# Patient Record
Sex: Female | Born: 2000 | Hispanic: Yes | Marital: Single | Smoking: Never smoker
Health system: Southern US, Community
[De-identification: ages and names within clinical notes are randomized; demographics above are authoritative.]

## PROBLEM LIST (undated history)

## (undated) DIAGNOSIS — O139 Gestational [pregnancy-induced] hypertension without significant proteinuria, unspecified trimester: Secondary | ICD-10-CM

## (undated) DIAGNOSIS — Z789 Other specified health status: Secondary | ICD-10-CM

## (undated) HISTORY — DX: Other specified health status: Z78.9

## (undated) HISTORY — PX: NO PAST SURGERIES: SHX2092

---
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
## 2021-02-18 DIAGNOSIS — R000 Tachycardia, unspecified: Secondary | ICD-10-CM | POA: Insufficient documentation

## 2021-02-18 DIAGNOSIS — R002 Palpitations: Secondary | ICD-10-CM | POA: Insufficient documentation

## 2021-02-18 DIAGNOSIS — E66811 Obesity, class 1: Secondary | ICD-10-CM | POA: Insufficient documentation

## 2022-04-24 ENCOUNTER — Ambulatory Visit: Payer: Medicaid Other

## 2022-04-24 DIAGNOSIS — Z23 Encounter for immunization: Secondary | ICD-10-CM

## 2022-06-11 ENCOUNTER — Ambulatory Visit: Payer: Medicaid Other | Admitting: Family Medicine

## 2022-09-01 ENCOUNTER — Telehealth: Payer: Self-pay

## 2022-09-01 NOTE — Telephone Encounter (Signed)
Patient called with concerns of white drainage coming out of her incision, warmth and finger length  opening now. She has some mild cramping. She denies fever. Please advise.

## 2022-09-03 NOTE — Telephone Encounter (Signed)
Attempted to call patient and follow-up, unable to leave voicemail at this time.

## 2023-03-24 ENCOUNTER — Encounter: Payer: Self-pay | Admitting: *Deleted

## 2023-03-24 ENCOUNTER — Other Ambulatory Visit: Payer: Self-pay | Admitting: *Deleted

## 2023-03-25 NOTE — L&D Delivery Note (Signed)
 OB/GYN Faculty Practice Delivery Note  Joann Martinez is a 23 y.o. [id number] s/p SVD at [redacted]w[redacted]d. She was admitted for IOL for LGA, newly Dx GHTN during admission.   ROM: 1h 63m with clear fluid GBS Status:  Negative/-- (07/11 1146) Maximum Maternal Temperature: 98.67F  Labor Progress: Initial SVE: 0.5/50/-3. Ripened with Cytotec  and Foley balloon. Augmented with AROM. Epidural. She then progressed to complete @0330 .   Delivery Date/Time: 10/24/2023 @0519   Delivery: Called to room and patient was complete and pushing. Head delivered LOA. Loose nuchal cord present, delivered through. Shoulder and body delivered in usual fashion. Infant with spontaneous cry, placed on mother's abdomen, dried and stimulated. Cord clamped x 2 after 1-minute delay, and cut by FOB. Cord blood drawn. Placenta delivered spontaneously with gentle cord traction. Fundus firm with massage and Pitocin . Labia, perineum, vagina, and cervix inspected with 1st degree perineal laceration. Repaired with 3.0 Vicryl.   Baby Weight: 4250g   Placenta: 3 vessel, intact. Sent to L&D Complications: None Lacerations: 1st degree perineal  EBL: 360 mL Analgesia: Epidural   Infant:  APGAR (1 MIN): 9  APGAR (5 MINS): 9   Mardy Shropshire, MD OB Family Medicine Fellow, [facility] for [facility], [facility] 10/24/2023, 7:07 AM 4250

## 2023-04-08 ENCOUNTER — Other Ambulatory Visit (INDEPENDENT_AMBULATORY_CARE_PROVIDER_SITE_OTHER): Payer: Self-pay

## 2023-04-08 ENCOUNTER — Ambulatory Visit: Payer: Medicaid Other | Admitting: *Deleted

## 2023-04-08 VITALS — BP 119/79 | HR 80 | Wt 162.4 lb

## 2023-04-08 DIAGNOSIS — Z1339 Encounter for screening examination for other mental health and behavioral disorders: Secondary | ICD-10-CM

## 2023-04-08 DIAGNOSIS — Z3A10 10 weeks gestation of pregnancy: Secondary | ICD-10-CM | POA: Diagnosis not present

## 2023-04-08 DIAGNOSIS — Z3401 Encounter for supervision of normal first pregnancy, first trimester: Secondary | ICD-10-CM

## 2023-04-08 DIAGNOSIS — Z3480 Encounter for supervision of other normal pregnancy, unspecified trimester: Secondary | ICD-10-CM | POA: Insufficient documentation

## 2023-04-08 DIAGNOSIS — O3680X0 Pregnancy with inconclusive fetal viability, not applicable or unspecified: Secondary | ICD-10-CM

## 2023-04-08 DIAGNOSIS — Z3481 Encounter for supervision of other normal pregnancy, first trimester: Secondary | ICD-10-CM | POA: Diagnosis not present

## 2023-04-08 DIAGNOSIS — Z3400 Encounter for supervision of normal first pregnancy, unspecified trimester: Secondary | ICD-10-CM

## 2023-04-08 MED ORDER — BLOOD PRESSURE KIT DEVI
1.0000 | 0 refills | Status: AC
Start: 2023-04-08 — End: ?

## 2023-04-08 MED ORDER — VITAFOL ULTRA 29-0.6-0.4-200 MG PO CAPS: 1.0000 | ORAL_CAPSULE | Freq: Every day | ORAL | 4 refills | Status: AC

## 2023-04-08 NOTE — Progress Notes (Signed)
New OB Intake  I connected with Joann Martinez  on 04/08/23 at 10:15 AM EST by In Person Video Visit and verified that I am speaking with the correct person using two identifiers. Nurse is located at [facility] and pt is located at [place].  I discussed the limitations, risks, security and privacy concerns of performing an evaluation and management service by telephone and the availability of in person appointments. I also discussed with the patient that there may be a patient responsible charge related to this service. The patient expressed understanding and agreed to proceed.  I explained I am completing New OB Intake today. We discussed EDD of 11/02/2023, by Last Menstrual Period. Pt is G2P1001. I reviewed her allergies, medications and Medical/Surgical/OB history.    Patient Active Problem List   Diagnosis Date Noted   Class 1 obesity in adult 02/18/2021   Palpitations 02/18/2021   Sinus tachycardia 02/18/2021    Concerns addressed today  Delivery Plans Plans to deliver at [facility]. Discussed the nature of our practice with multiple providers including residents and students. Due to the size of the practice, the delivering provider may not be the same as those providing prenatal care.   Patient is not interested in water birth. Offered upcoming OB visit with CNM to discuss further.  MyChart/Babyscripts MyChart access verified. I explained pt will have some visits in office and some virtually. Babyscripts instructions given and order placed. Patient verifies receipt of registration text/e-mail. Account successfully created and app downloaded. If patient is a candidate for Optimized scheduling, add to sticky note.   Blood Pressure Cuff/Weight Scale Blood pressure cuff ordered for patient to pick-up from [employer]. Explained after first prenatal appt pt will check weekly and document in Babyscripts. Patient does not have weight scale; patient may purchase if they desire to  track weight weekly in Babyscripts.  Anatomy US Explained first scheduled [place] will be around 19 weeks. Anatomy US scheduled for TBD at TBD.  Interested in [place]? If yes, send referral and doula dot phrase.   Is patient a candidate for Babyscripts Optimization? No, due to Risk Factors   First visit review I reviewed new OB appt with patient. Explained pt will be seen by Dr. Nobie Putnam at first visit. Discussed Avelina Laine genetic screening with patient. Requests Panorama  Routine prenatal labs  collected at today's visit.    Last Pap No results found for: "DIAGPAP"  Harrel Lemon, RN 04/08/2023  10:43 AM

## 2023-04-08 NOTE — Patient Instructions (Signed)

## 2023-04-10 LAB — CBC/D/PLT+RPR+RH+ABO+RUBIGG...
Antibody Screen: NEGATIVE
Basophils Absolute: 0 10*3/uL (ref 0.0–0.2)
Basos: 0 %
EOS (ABSOLUTE): 0.1 10*3/uL (ref 0.0–0.4)
Eos: 1 %
HCV Ab: NONREACTIVE
HIV Screen 4th Generation wRfx: NONREACTIVE
Hematocrit: 41.5 % (ref 34.0–46.6)
Hemoglobin: 13.6 g/dL (ref 11.1–15.9)
Hepatitis B Surface Ag: NEGATIVE
Immature Grans (Abs): 0.1 10*3/uL (ref 0.0–0.1)
Immature Granulocytes: 1 %
Lymphocytes Absolute: 1.7 10*3/uL (ref 0.7–3.1)
Lymphs: 20 %
MCH: 28.2 pg (ref 26.6–33.0)
MCHC: 32.8 g/dL (ref 31.5–35.7)
MCV: 86 fL (ref 79–97)
Monocytes Absolute: 0.4 10*3/uL (ref 0.1–0.9)
Monocytes: 5 %
Neutrophils Absolute: 6.4 10*3/uL (ref 1.4–7.0)
Neutrophils: 73 %
Platelets: 324 10*3/uL (ref 150–450)
RBC: 4.82 x10E6/uL (ref 3.77–5.28)
RDW: 14.2 % (ref 11.7–15.4)
RPR Ser Ql: NONREACTIVE
Rh Factor: POSITIVE
Rubella Antibodies, IGG: <0.9 {index} — ABNORMAL LOW (ref >0.99)
WBC: 8.7 10*3/uL (ref 3.4–10.8)

## 2023-04-10 LAB — URINE CULTURE, OB REFLEX

## 2023-04-10 LAB — CULTURE, OB URINE

## 2023-04-10 LAB — HCV INTERPRETATION

## 2023-04-16 LAB — PANORAMA PRENATAL TEST FULL PANEL:PANORAMA TEST PLUS 5 ADDITIONAL MICRODELETIONS: FETAL FRACTION: 13

## 2023-05-13 ENCOUNTER — Encounter: Payer: Medicaid Other | Admitting: Family Medicine

## 2023-05-28 ENCOUNTER — Ambulatory Visit: Payer: Medicaid Other | Admitting: Nurse Practitioner

## 2023-05-28 ENCOUNTER — Other Ambulatory Visit (HOSPITAL_COMMUNITY)
Admission: RE | Admit: 2023-05-28 | Discharge: 2023-05-28 | Disposition: A | Discharge status: Home / Self Care | Source: Ambulatory Visit | Attending: Nurse Practitioner | Admitting: Nurse Practitioner

## 2023-05-28 VITALS — BP 108/74 | HR 87 | Wt 172.3 lb

## 2023-05-28 DIAGNOSIS — Z3480 Encounter for supervision of other normal pregnancy, unspecified trimester: Secondary | ICD-10-CM | POA: Diagnosis present

## 2023-05-28 DIAGNOSIS — O09892 Supervision of other high risk pregnancies, second trimester: Secondary | ICD-10-CM

## 2023-05-28 DIAGNOSIS — Z3A17 17 weeks gestation of pregnancy: Secondary | ICD-10-CM

## 2023-05-28 DIAGNOSIS — O0992 Supervision of high risk pregnancy, unspecified, second trimester: Secondary | ICD-10-CM | POA: Diagnosis not present

## 2023-05-28 DIAGNOSIS — Z2839 Other underimmunization status: Secondary | ICD-10-CM

## 2023-05-28 DIAGNOSIS — N760 Acute vaginitis: Secondary | ICD-10-CM | POA: Diagnosis not present

## 2023-05-28 DIAGNOSIS — Z3A18 18 weeks gestation of pregnancy: Secondary | ICD-10-CM

## 2023-05-28 DIAGNOSIS — O09899 Supervision of other high risk pregnancies, unspecified trimester: Secondary | ICD-10-CM

## 2023-05-28 DIAGNOSIS — B9689 Other specified bacterial agents as the cause of diseases classified elsewhere: Secondary | ICD-10-CM

## 2023-05-28 DIAGNOSIS — Z8679 Personal history of other diseases of the circulatory system: Secondary | ICD-10-CM

## 2023-05-28 MED ORDER — ASPIRIN 81 MG PO TBEC: 81.0000 mg | DELAYED_RELEASE_TABLET | Freq: Every day | ORAL | 12 refills | Status: DC

## 2023-05-28 NOTE — Progress Notes (Signed)
 Pt presents for new ob. Pt has no questions or concerns at this time.

## 2023-05-28 NOTE — Progress Notes (Signed)
 History:   Joann Martinez is a 23 y.o. G2P1001 at [redacted]w[redacted]d by LMP being seen today for her first obstetrical visit.  Her obstetrical history is significant for pregnancy induced hypertension. Patient does intend to breast feed. Pregnancy history fully reviewed.  Patient reports no complaints.      HISTORY: OB History  Gravida Para Term Preterm AB Living  2 1 1  0 0 1  SAB IAB Ectopic Multiple Live Births  0 0 0 0 1    # Outcome Date GA Lbr Len/2nd Weight Sex Type Anes PTL Lv  2 Current           1 Term 05/25/20 [redacted]w[redacted]d 05:48 / 01:29 8 lb 7.9 oz (3.853 kg) F Vag-Spont EPI  LIV     Name: BELTRAN-GONZALEZ,GIRL Lindyn     Apgar1: 9  Apgar5: 9      Past Medical History:  Diagnosis Date   Pregnancy induced hypertension    Past Surgical History:  Procedure Laterality Date   NO PAST SURGERIES     Family History  Problem Relation Age of Onset   Healthy Mother    Healthy Father    Social History   Tobacco Use   Smoking status: Never   Smokeless tobacco: Never  Vaping Use   Vaping status: Never Used  Substance Use Topics   Alcohol use: Never   Drug use: Never   No Known Allergies Current Outpatient Medications on File Prior to Visit  Medication Sig Dispense Refill   Prenat-Fe Poly-Methfol-FA-DHA (VITAFOL ULTRA) 29-0.6-0.4-200 MG CAPS Take 1 capsule by mouth daily before breakfast. 90 capsule 4   acetaminophen (TYLENOL) 500 MG tablet Take 2 tablets (1,000 mg total) by mouth every 6 (six) hours as needed (for pain scale < 4). (Patient not taking: Reported on 05/28/2023)     amLODipine (NORVASC) 5 MG tablet Take 1 tablet (5 mg total) by mouth daily. (Patient not taking: Reported on 05/28/2023) 30 tablet 0   Blood Pressure Monitoring (BLOOD PRESSURE KIT) DEVI 1 kit by Does not apply route once a week. Check Blood Pressure regularly and record readings into the Babyscripts App.  Large Cuff.  DX O90.0 (Patient not taking: Reported on 04/08/2023) 1 each 0   Blood Pressure  Monitoring (BLOOD PRESSURE KIT) DEVI 1 Device by Does not apply route once a week. (Patient not taking: Reported on 05/28/2023) 1 each 0   No current facility-administered medications on file prior to visit.    Review of Systems Pertinent items noted in HPI and remainder of comprehensive ROS otherwise negative. Physical Exam:   Vitals:   05/28/23 1609  BP: 108/74  Pulse: 87  Weight: 172 lb 4.8 oz (78.2 kg)   Fetal Heart Rate (bpm): 160  Constitutional: Well-developed, well-nourished pregnant female in no acute distress.  HEENT: PERRLA Skin: normal color and turgor, no rash Cardiovascular: normal rate & rhythm, warm and well perfused Respiratory: normal effort, no problems with respiration noted GI: Abd soft, non-distended MS: Extremities nontender, no edema, normal ROM Neurologic: Alert and oriented x 4.  GU: no CVA tenderness Pelvic: NEFG, physiologic discharge, no blood, cervix clean. Pap/swabs collected     Assessment:    Pregnancy: G2P1001 Patient Active Problem List   Diagnosis Date Noted   Supervision of other normal pregnancy, antepartum 04/08/2023   Class 1 obesity in adult 02/18/2021   Palpitations 02/18/2021   Sinus tachycardia 02/18/2021     Plan:   1. Supervision of other normal pregnancy, antepartum (Primary) -  FHR at 160, feeling well, no complaints - Cervicovaginal ancillary only( [place]) - Cytology - PAP( [place]) - AFP, Serum, Open Spina Bifida - Urinalysis, Routine w reflex microscopic  2. H/O: gHTN hypertension - Patient reports she is no longer taking Amlodipine 5 mg ( Resolved gHTN with prior pregnancy 2022) - aspirin EC 81 MG tablet; Take 1 tablet (81 mg total) by mouth daily. Swallow whole.  Dispense: 30 tablet; Refill: 12  3. Supervision of high risk pregnancy in second trimester  The patient has the following indications for aspirinto begin 81 mg at 12-16 weeks: One high risk condition: h/o gHTN with prior pregnancy  MORE than  one moderate risk condition: none Aspirin was  recommended today based upon above risk factors (one high risk condition or more than one moderate risk factor)     4. RNI - Offer PPV    - Initial labs reviewed - Continue prenatal vitamins. - Problem list reviewed and updated. - Genetic Screening discussed, First trimester screen, Quad screen, and NIPS: requested. - Ultrasound discussed; fetal anatomic survey: requested. - Anticipatory guidance about prenatal visits given including labs, ultrasounds, and testing. - Discussed usage of Babyscripts and virtual visits as additional source of managing and completing prenatal visits in midst of coronavirus and pandemic.   - Encouraged to complete MyChart Registration for her ability to review results, send requests, and have questions addressed.  - The nature of [place] [facility]Faculty Practice with multiple MDs and Advanced Practice Providers was explained to patient; also emphasized that residents, students are part of our team. - Routine obstetric precautions reviewed. Encouraged to seek out care at office or emergency room [facility] preferred) for urgent and/or emergent concerns.  Return in about 4 weeks (around 06/25/2023) for [facility].    Future Appointments  Date Time Provider Department Center  06/08/2023  2:15 PM [facility] NURSE [facility]  06/08/2023  2:30 PM [facility]  06/25/2023  3:30 PM Gerrit Heck, CNM [facility] None    Marcell Barlow, MSN, [facility], [facility] [employer]

## 2023-05-30 LAB — AFP, SERUM, OPEN SPINA BIFIDA
AFP MoM: 1.12
AFP Value: 38.9 ng/mL
Gest. Age on Collection Date: 17 wk
Maternal Age At EDD: 22.3 a
OSBR Risk 1 IN: 8371
Test Results:: NEGATIVE
Weight: 172 [lb_av]

## 2023-05-31 ENCOUNTER — Encounter: Payer: Self-pay | Admitting: Nurse Practitioner

## 2023-05-31 NOTE — Patient Instructions (Signed)
 We highly recommend childbirth education to help you plan for labor and begin practicing coping skills (which will be needed with or without pain meds).  [place] Childbirth Education Options: Sign up by visiting [email or web address]  Childbirth ~ Self-Paced eClass (English and Spanish) This online class offers you the freedom to complete a childbirth education series in the comfort of your own home at your own pace.  Childbirth Class (In-Person 4-Week Series  or on Saturdays, Virtual 4-Week Series ~ [place]) This interactive in-person class series will help you and your partner prepare for your birth experience. Topics include: Labor & Birth, Comfort Measures, Breathing Techniques, Massage, Medical Interventions, Pain Management Options, Cesarean Birth, Postpartum Care, and Newborn Care  Comfort Techniques for Labor ~ In-Person Class [facility]) This interactive class is designed for parents-to-be who want to learn & practice hands-on skills to help relieve some of the discomfort of labor and encourage their babies to rotate toward the best position for birth. Moms and their partners will be able to try a variety of labor positions with birth balls and rebozos as well as practice breathing, relaxation, and visualization techniques.  Natural Childbirth Class (In-Person 5-Week Series, In-Person on Saturdays or Virtual 5-Week Series ~ [place]) This class series is designed for expectant parents who want to learn and practice natural methods of coping with the process of labor and childbirth.  Cesarean Birth Self-Paced eClass (English and Spanish) This online course provides comprehensive information you can trust as you prepare for a possible cesarean birth. In this class, you'll learn how to make your birth and recovery comfortable and joyful through instructive video clips, animations, and activities.  Waterbirth ~ Airline pilot Interested in a waterbirth? In addition to a consultation  with your credentialed waterbirth provider, this free, informational online class will help you discover whether waterbirth is the right fit for you. Not all obstetrical practices offer waterbirth, so check with your healthcare provider.  Tour Probation officer) - [facility] [employer] our 4 minute video tour of [facility] located in [place].   [place] Parenting Education Options:  Pregnancy 101 (Virtual) Congratulations on your pregnancy! This class is geared toward moms in their first trimester, but everyone is welcome. We are excited to guide you through all aspects of supporting a healthy pregnancy. You will learn what to expect at routine prenatal care appointments, common postpartum adjustments, basic infant safety, and breastfeeding.  Successful Partnering & Parenting ~ In-Person Workshop [facility]) This workshop inspires and equips partners of all economic levels, ages, and cultures to confidently care for their infants, support the birthing persons, and navigate their own transformations into new partners and parents. Learning activities are geared towards supporting partner, but moms are welcome to attend.  'Baby & Me' Parenting Group (Virtual on Wednesdays at 11am) Enjoy this time discussing newborn & infant parenting topics and family adjustment issues with other new parents in a relaxed environment. Each week brings a new speaker or baby-centered activity. This group offers support and connection to parents as they journey through the adjustments and struggles of that sometimes overwhelming first year after the birth of a child.  Baby Safety, CPR, & Choking Class ~ Virtual This life-saving information is meant to encourage parents as they learn important safety and prevention tips as well as infant CPR and relief of choking.  Breastfeeding Class (In-Person in [place] or [employer]) Families learn what to expect in the  first days and weeks of breastfeeding your  newborn. IF YOU ARE AN EMPLOYEE TAKING THIS CLASS FOR CREDIT, DO NOT register yourself. Please e-mail [email or web address].   Breastfeeding Self-Paced eClass (English & Spanish) Families learn what to expect in the first days and weeks of breastfeeding your newborn.  Caring for Baby ~ In-Person, Virtual or Self-Paced Class This in-person class is for both expectant and adoptive parents who want to learn and practice the most up-to-date newborn care for their babies. Focus is on birth through the first six weeks of life.  CPR & Choking Relief for Infants & Children ~ In-Person Class [facility]) This in-person course is designed for any parent, expectant parent, or adult who cares for infants or children. Participants learn and demonstrate cardiopulmonary resuscitation and choking relief procedures for both infants and children.  Grandparent Love ~ In-Person Class Grandparents will learn the most updated infant care and safety recommendations. They will discover ways to support their own children during the transition into the parenting role and receive tips on communicating with the new parents.  [place] Parenting Support Group Options:  Bereavement Grief Support Group (Pregnancy/Infant Loss) - Virtual This is an ongoing experience that meets once a month and is designed to help you honor the past, assist you in discovering tools to strengthen you today, and aid you in developing hope for the future.  Breastfeeding & Pumping Support Group (In-Person on Thursdays at 12pm or Virtual on Tuesdays at 5pm) Join [place] in-person each Thursday starting June 1st, 2023 at 12pm! This support group is free for all families looking for breastfeeding and/or pumping support.   Community-Based Childbirth Education Options:  [facility] Department Classes:  Childbirth education classes can help you get ready for a positive parenting experience. You  can also meet other expectant parents and get free stuff for your baby. Each class runs for five weeks on the same night and costs $45 for the mother-to-be and her support person. Medicaid covers the cost if you are eligible. Call [phone] to register.  [facility] offers a variety of programs for the [employer] and is another great way to get connected. Please go to [email or web address] for more information.  Childbirth With A Twist! Be informed of your options, get educated on birth, understand what your body is doing, learn how to cope, and have a lot of fun and laughs all while doing it either from the comfort of your couch OR in our cozy office and classroom space near the [place] airport. If you are taking a virtual class, then class is taught LIVE, so you can ask questions and receive answers in real-time from an experienced doula and childbirth educator.  This virtual childbirth education class will meet for five instruction times online.  Although we are based in [place], [place], this virtual class is open to anyone in the world. Please visit: [email or web address] for more information.  Books We Love: The Doula Guide to Childbirth by Harland German and Otila Back The First-Time Parent's Childbirth Handbook by Dr. Amie Critchley, CNM The Birth Partner by Truddie Crumble    Commonly Asked Questions During Pregnancy  How Will I Feel When I'm Pregnant? Pregnancy symptoms in the first trimester of pregnancy may not appear until the middle or end of the second month. Hormonal changes will cause tenderness in your breasts, and you may begin to feel more tired than usual. Food cravings, an increase in the need to urinate, and morning sickness may all be more noticeable.  Pregnancy symptoms in the second  trimester are more prominent. You may start to feel the baby move and become more active. Dental issues, nasal/sinus problems,  and skin irritations can begin to appear. Heartburn, leg cramps, dizziness, and a vaginal discharge are also common. Every woman is different when it comes to the symptoms they experience, and some may not experience any at all. Pregnancy symptoms in the third trimester can include increased frequency in urination, leg cramps, constipation, ligament pain in the abdomen, and weight gain. Back pain and Braxton Hicks contractions will become increasingly more common.  Why is nutrition during pregnancy important? Eating well is one of the best things you can do during pregnancy. Good nutrition helps you handle the extra demands on your body as your pregnancy progresses. The goal is to balance getting enough nutrients to support the growth of your fetus and maintaining a healthy weight.  How much water should I drink during pregnancy? During pregnancy you should drink 8 to 12 cups (64 to 96 ounces) of water every day. Water has many benefits. It aids digestion and helps form the amniotic fluid around the fetus. Water also helps nutrients circulate in the body and helps waste leave the body.  What can I do to help with nausea? Eat dry toast or crackers in the morning before you get out of bed to avoid moving around on an empty stomach. Eat five or six "mini meals" a day to ensure that your stomach is never empty. Eat frequent bites of foods like nuts, fruits, or crackers.  What can help with constipation during pregnancy? Constipation is common near the end of pregnancy. Eating more foods with fiber can help fight constipation. Fiber is found in fruits, vegetables, whole grains, beans, nuts, and seeds. You should aim for about 25 grams of fiber in your diet each day. Drink a lot of water as you increase your fiber intake.  How much coffee can I drink while I'm pregnant? Research suggests that moderate caffeine consumption (less than 200 milligrams per day) does not cause miscarriage or preterm birth.  That's the amount in one 12-ounce cup of coffee. Remember that caffeine also is found in tea, chocolate, energy drinks, and soft drinks. Caffeine can interfere with sleep and contribute to nausea and light-headedness. Caffeine also can increase urination and lead to dehydration.  What can I do to prevent or ease back pain during pregnancy? There are several things you can do to prevent or ease back pain. For example, wear supportive clothing and shoes. Pay attention to your position when sitting, sleeping, and lifting things. If you need to stand for a long time, rest one foot on a stool or a box to take the strain off your back. You also can use heat or cold to soothe sore muscles.  Is it safe to exercise during pregnancy? If you are healthy and your pregnancy is normal, it is safe to continue or start regular physical activity. Physical activity does not increase your risk of miscarriage, low birth weight, or early delivery. It's still important to discuss exercise with your ob-gyn provider during your early prenatal visits.   What are the benefits of exercise during pregnancy? Regular exercise during pregnancy benefits you and your fetus in these key ways: Reduces back pain Eases constipation May decrease your risk of gestational diabetes, preeclampsia, and cesarean birth Promotes healthy weight gain during pregnancy Improves your overall fitness and strengthens your heart and blood vessels Helps you to lose the baby weight after your baby is  born  Is it safe to dye my hair during pregnancy? Yes, it's safe. Only a small amount of chemicals from hair dye is absorbed through the scalp.  Is it safe to keep a cat during pregnancy? Yes, you can keep your cat. You may have heard that cat feces can carry the infection toxoplasmosis. This infection is only found in cats who go outdoors and hunt prey, such as mice and other rodents. If you do have a cat who goes outdoors or eats prey, have someone  else take over daily cleaning the litter box. This will keep you away from any cat feces. If you have an indoor cat who only eats cat food and doesn't have contact with outside animals, your risk of toxoplasmosis is very low.  What substances should I avoid during pregnancy? During pregnancy, women should not use tobacco, alcohol, marijuana, illegal drugs, or prescription medications for nonmedical reasons. Avoiding these substances and getting regular prenatal care are important to having a healthy pregnancy and a healthy baby.   What foods to I need to avoid in pregnancy? To help prevent listeriosis, avoid eating the following foods while you are pregnant: Unpasteurized milk and foods made with unpasteurized milk, including soft cheeses Hot dogs and luncheon meats, unless they are heated until steaming hot just before serving Unwashed raw produce such as fruits and vegetables  Avoid all raw and undercooked seafood, eggs, meat, and poultry while you are pregnant. Do not eat sushi made with raw fish (cooked sushi is safe). Cooking and pasteurization are the only ways to kill Listeria.  Limit your exposure to mercury by not eating bigeye tuna, king mackerel, marlin, orange roughy, shark, swordfish, or tilefish. Limit eating white (albacore) tuna to 6 ounces a week. You do not have to avoid all fish during pregnancy. In fact, fish and shellfish are nutritious foods with vital nutrients for a pregnant woman and her fetus. Be sure to eat at least 8-12 ounces of low-mercury fish and shellfish per week.  Is travel safe to during pregnancy? In most cases, pregnant women can travel safely until close to their due dates. But travel may not be recommended for women who have pregnancy complications. If you are planning a trip, talk with your (ob-gyn) provider. And no matter how you choose to travel, think ahead about your comfort and safety.  Can I use a sauna or hot tub early in pregnancy? It's best not to.  Your core body temperature rises when you use saunas and hot tubs. This rise in temperature can be harmful for your fetus.  Can I get a massage while pregnant? Yes. Massage is a good way to relax and improve circulation. The best position for a massage while you're pregnant is lying on your side, rather than facedown. Some massage tables have a cut-out for the belly, allowing you to lie facedown comfortably. Tell your massage therapist that you're pregnant if you're not showing yet. Many health spas offer special prenatal massages done by therapists who are trained to work on pregnant women.  Is Having Dental Work While Pregnant Safe? Pregnancy and dental work questions are common for expecting moms. Preventive dental cleanings and annual exams during pregnancy are not only safe but are recommended. The rise in hormone levels during pregnancy causes the gums to swell, bleed, and trap food causing increased irritation to your gums. Preventive dental work while pregnant is essential to avoid oral infections such as gum disease, which has been linked to preterm birth.  The American Dental Association (ADA) recommends pregnant women eat a balanced diet, brush their teeth thoroughly with ADA-approved fluoride toothpaste twice a day, and floss daily. Have preventive exams and cleanings during your pregnancy. Let your dentist know you are pregnant. Postpone non-emergency dental work until the second trimester or after delivery, if possible. Elective procedures should be postponed until after the delivery.   Safe Medications in Pregnancy   Acne:  Benzoyl Peroxide  Salicylic Acid   Backache/Headache:  Tylenol: 2 regular strength every 4 hours OR               2 Extra strength every 6 hours   Colds/Coughs/Allergies:  Benadryl (alcohol free) 25 mg every 6 hours as needed  Breath right strips  Claritin  Cepacol throat lozenges  Chloraseptic throat spray  Cold-Eeze- up to three times per day  Cough  drops, alcohol free  Flonase (by prescription only)  Guaifenesin  Mucinex  Robitussin DM (plain only, alcohol free)  Saline nasal spray/drops  Sudafed (pseudoephedrine) & Actifed * use only after [redacted] weeks gestation and if you do not have high blood pressure  Tylenol  Vicks Vaporub  Zinc lozenges  Zyrtec   Constipation:  Colace  Ducolax suppositories  Fleet enema  Glycerin suppositories  Metamucil  Milk of magnesia  Miralax  Senokot  Smooth move tea   Diarrhea:  Kaopectate  Imodium A-D   *NO pepto Bismol   Hemorrhoids:  Anusol  Anusol HC  Preparation H  Tucks   Indigestion:  Tums  Maalox  Mylanta  Zantac  Pepcid   Insomnia:  Benadryl (alcohol free) 25mg  every 6 hours as needed  Tylenol PM  Unisom, no Gelcaps   Leg Cramps:  Tums  MagGel   Nausea/Vomiting:  Bonine  Dramamine  Emetrol  Ginger extract  Sea bands  Meclizine  Nausea medication to take during pregnancy:  Unisom (doxylamine succinate 25 mg tablets) Take one tablet daily at bedtime. If symptoms are not adequately controlled, the dose can be increased to a maximum recommended dose of two tablets daily (1/2 tablet in the morning, 1/2 tablet mid-afternoon and one at bedtime).  Vitamin B6 100mg  tablets. Take one tablet twice a day (up to 200 mg per day).   Skin Rashes:  Aveeno products  Benadryl cream or 25mg  every 6 hours as needed  Calamine Lotion  1% cortisone cream   Yeast infection:  Gyne-lotrimin 7  Monistat 7    **If taking multiple medications, please check labels to avoid duplicating the same active ingredients  **take medication as directed on the label  ** Do not exceed 4000 mg of tylenol in 24 hours  **Do not take medications that contain aspirin or ibuprofen

## 2023-06-01 ENCOUNTER — Encounter: Payer: Self-pay | Admitting: Nurse Practitioner

## 2023-06-01 DIAGNOSIS — Z8759 Personal history of other complications of pregnancy, childbirth and the puerperium: Secondary | ICD-10-CM | POA: Insufficient documentation

## 2023-06-01 LAB — CERVICOVAGINAL ANCILLARY ONLY
Bacterial Vaginitis (gardnerella): POSITIVE — AB
Candida Glabrata: NEGATIVE
Candida Vaginitis: NEGATIVE
Chlamydia: NEGATIVE
Comment: NEGATIVE
Comment: NEGATIVE
Comment: NEGATIVE
Comment: NEGATIVE
Comment: NEGATIVE
Comment: NORMAL
Neisseria Gonorrhea: NEGATIVE
Trichomonas: NEGATIVE

## 2023-06-01 MED ORDER — METRONIDAZOLE 500 MG PO TABS: 500.0000 mg | ORAL_TABLET | Freq: Two times a day (BID) | ORAL | 0 refills | Status: DC

## 2023-06-01 NOTE — Addendum Note (Signed)
 Addended by: Marcell Barlow on: 06/01/2023 10:36 PM   Modules accepted: Orders

## 2023-06-02 LAB — CYTOLOGY - PAP: Diagnosis: NEGATIVE

## 2023-06-08 ENCOUNTER — Ambulatory Visit: Payer: Medicaid Other | Attending: Obstetrics & Gynecology

## 2023-06-08 ENCOUNTER — Ambulatory Visit: Payer: Medicaid Other | Admitting: *Deleted

## 2023-06-08 ENCOUNTER — Other Ambulatory Visit: Payer: Self-pay | Admitting: *Deleted

## 2023-06-08 ENCOUNTER — Encounter: Payer: Self-pay | Admitting: *Deleted

## 2023-06-08 ENCOUNTER — Ambulatory Visit: Admitting: Maternal & Fetal Medicine

## 2023-06-08 VITALS — BP 109/66 | HR 81

## 2023-06-08 DIAGNOSIS — Z8759 Personal history of other complications of pregnancy, childbirth and the puerperium: Secondary | ICD-10-CM | POA: Diagnosis present

## 2023-06-08 DIAGNOSIS — Z3A19 19 weeks gestation of pregnancy: Secondary | ICD-10-CM | POA: Diagnosis present

## 2023-06-08 DIAGNOSIS — O09292 Supervision of pregnancy with other poor reproductive or obstetric history, second trimester: Secondary | ICD-10-CM

## 2023-06-08 DIAGNOSIS — Z369 Encounter for antenatal screening, unspecified: Secondary | ICD-10-CM | POA: Diagnosis not present

## 2023-06-08 DIAGNOSIS — Z363 Encounter for antenatal screening for malformations: Secondary | ICD-10-CM | POA: Insufficient documentation

## 2023-06-08 DIAGNOSIS — O10912 Unspecified pre-existing hypertension complicating pregnancy, second trimester: Secondary | ICD-10-CM | POA: Diagnosis not present

## 2023-06-08 DIAGNOSIS — Z3480 Encounter for supervision of other normal pregnancy, unspecified trimester: Secondary | ICD-10-CM

## 2023-06-08 DIAGNOSIS — Z3400 Encounter for supervision of normal first pregnancy, unspecified trimester: Secondary | ICD-10-CM | POA: Diagnosis not present

## 2023-06-08 NOTE — Progress Notes (Signed)
 Patient information  Patient Name: Joann Martinez  Patient MRN:   [id number]  Referring practice: MFM Referring Provider: [facility])  MFM CONSULT  Joann Martinez is a 23 y.o. G2P1001 at [redacted]w[redacted]d here for ultrasound and consultation. Patient Active Problem List   Diagnosis Date Noted   History of gestational hypertension 06/01/2023   Supervision of other normal pregnancy, antepartum 04/08/2023   Palpitations 02/18/2021   Sinus tachycardia 02/18/2021    Joann Martinez has a pregnancy with the complications mentioned in the problem list. During today's visit we focused on the following concerns:   RE hx of GTN: Discussed the importance of prenatal ASA. Recurrence risk is about 15% but is reduced with prenatal ASA.   Counseling I discussed the potential adverse obstetric, fetal and neonatal associations with each specific pregnancy complication. I discussed the risk and impact of GHTN/preeclampsia on her pregnancy and the role of baseline laboratory assessment of kidney, liver and platelet count as well as the role of low dose Aspirin to the reduce the risk of developing preeclampsia. I reassured the patient that we expect a favorable pregnancy outcome but due to her pregnancy complications she will need a higher level of monitoring for her pregnancy compared to a pregnancy without complications. The patient had time to ask questions that were answered to her satisfaction. She verbalized understanding of our discussion and request to proceed with the plan outlined in the recommendations.   Sonographic findings Single intrauterine pregnancy at [redacted]w[redacted]d. Fetal cardiac activity:  Observed and appears normal. Presentation: Cephalic. The anatomic structures that were well seen appear normal without evidence of soft markers. The anatomic survey is complete.  Fetal biometry shows the estimated fetal weight at the 61 percentile. Amniotic fluid:  Within normal limits.  MVP: 6.1 cm. Placenta: Posterior. Adnexa: No adnexal mass visualized. Cervical length: 4.7 cm.  There are limitations of prenatal ultrasound such as the inability to detect certain abnormalities due to poor visualization. Various factors such as fetal position, gestational age and maternal body habitus may increase the difficulty in visualizing the fetal anatomy.    Recommendations -EDD should be 10/27/2023 based on  Early Ultrasound  (04/08/23). The patient is uncertain of her LMP. -Baseline preeclampsia labs: CMP, CBC and urine protein/creatinine ratio if not previously completed.  -Continue Aspirin 81 mg for preeclampsia prophylaxis -Serial growth ultrasounds starting around 28 weeks to monitor for fetal growth restriction -Continue routine prenatal care with referring OB provider  Review of Systems: A review of systems was performed and was negative except per HPI   Vitals and Physical Exam    06/08/2023    2:34 PM 05/28/2023    4:09 PM 04/08/2023   10:28 AM  Vitals with BMI  Weight  172 lbs 5 oz 162 lbs 6 oz  Systolic 109 108 409  Diastolic 66 74 79  Pulse 81 87 80  Sitting comfortably on the sonogram table Nonlabored breathing Normal rate and rhythm Abdomen is nontender  Past pregnancies OB History  Gravida Para Term Preterm AB Living  2 1 1  0 0 1  SAB IAB Ectopic Multiple Live Births  0 0 0 0 1    # Outcome Date GA Lbr Len/2nd Weight Sex Type Anes PTL Lv  2 Current           1 Term 05/25/20 [redacted]w[redacted]d 05:48 / 01:29 8 lb 7.9 oz (3.853 kg) F Vag-Spont EPI  LIV     I spent 15 minutes  reviewing the patients chart, including labs and images as well as counseling the patient about her medical conditions. Greater than 50% of the time was spent in direct face-to-face patient counseling.  Braxton Feathers, DO Maternal fetal medicine,    06/08/2023  3:42 PM

## 2023-06-09 ENCOUNTER — Other Ambulatory Visit: Payer: Self-pay | Admitting: *Deleted

## 2023-06-09 DIAGNOSIS — Z8759 Personal history of other complications of pregnancy, childbirth and the puerperium: Secondary | ICD-10-CM

## 2023-06-25 ENCOUNTER — Ambulatory Visit

## 2023-06-25 VITALS — BP 118/82 | HR 93 | Wt 179.0 lb

## 2023-06-25 DIAGNOSIS — Z3482 Encounter for supervision of other normal pregnancy, second trimester: Secondary | ICD-10-CM

## 2023-06-25 DIAGNOSIS — Z3A22 22 weeks gestation of pregnancy: Secondary | ICD-10-CM

## 2023-06-25 DIAGNOSIS — Z3480 Encounter for supervision of other normal pregnancy, unspecified trimester: Secondary | ICD-10-CM

## 2023-06-25 NOTE — Progress Notes (Signed)
D

## 2023-06-25 NOTE — Progress Notes (Signed)
 Needs refill of PNV. Denies problems today.

## 2023-06-25 NOTE — Progress Notes (Signed)
   LOW-RISK PREGNANCY OFFICE VISIT  Patient name: Joann Martinez MRN [id number]  Date of birth: [birth year] Chief Complaint:   Routine Prenatal Visit  Subjective:   Joann Martinez is a 23 y.o. G8P1001 female at [redacted]w[redacted]d with an Estimated Date of Delivery: 10/27/23 being seen today for ongoing management of a low-risk pregnancy aeb has Palpitations; Sinus tachycardia; Supervision of other normal pregnancy, antepartum; and History of gestational hypertension on their problem list.  Patient presents today, with her mother, with no complaints.  Patient endorses fetal movement. Patient denies abdominal cramping or contractions.  Patient denies vaginal concerns including abnormal discharge, leaking of fluid, and bleeding. No issues with urination, constipation, or diarrhea.    Contractions: Not present. Vag. Bleeding: None, Scant.  Movement: Present.  Reviewed past medical,surgical, social, obstetrical and family history as well as problem list, medications and allergies.  Objective   Vitals:   06/25/23 1541  BP: 118/82  Pulse: 93  Weight: 179 lb (81.2 kg)  Body mass index is 28.89 kg/m.  Total Weight Gain:18 lb (8.165 kg)         Physical Examination:   General appearance: Well appearing, and in no distress  Mental status: Alert, oriented to person, place, and time  Skin: Warm & dry  Cardiovascular: Normal heart rate noted  Respiratory: Normal respiratory effort, no distress  Abdomen: Soft, gravid, nontender, AGA with Fundal Height: 24 cm  Pelvic: Cervical exam deferred           Extremities: Edema: None  Fetal Status: Fetal Heart Rate (bpm): 142  Movement: Present   No results found for this or any previous visit (from the past 24 hours).  Assessment & Plan:  Low-risk pregnancy of a 23 y.o., G2P1001 at [redacted]w[redacted]d with an Estimated Date of Delivery: 10/27/23   1. Supervision of other normal pregnancy, antepartum -Anticipatory guidance for upcoming appts. -Patient to  schedule next appt in 5-6 weeks for an in-person visit. -Reviewed Glucola appt preparation including fasting the night before and morning of.   *Informed that it is okay to drink plain water throughout the night and prior to consumption of Glucola formula.  -Discussed anticipated office time of 2.5-3 hours.  -Reviewed blood draw procedures and labs which also include check of iron/HgB level, RPR, and HIV *Informed that repeat RPR/HIV are for pediatric records/compliance.  -Discussed how results of GTT are handled including diabetic education and BS testing for abnormal results and routine care for normal results.   2. [redacted] weeks gestation of pregnancy -Doing well. -Plan for GTT at next visit. -F/U US scheduled for 5/19      Meds: No orders of the defined types were placed in this encounter.  Labs/procedures today:  Lab Orders  No laboratory test(s) ordered today     Reviewed: Preterm labor symptoms and general obstetric precautions including but not limited to vaginal bleeding, contractions, leaking of fluid and fetal movement were reviewed in detail with the patient.  All questions were answered.  Follow-up: Return in about 5 weeks (around 07/30/2023).  No orders of the defined types were placed in this encounter.  Cherre Robins MSN, CNM 06/25/2023

## 2023-07-21 ENCOUNTER — Encounter: Payer: Self-pay | Admitting: Physician Assistant

## 2023-07-30 ENCOUNTER — Encounter: Admitting: Physician Assistant

## 2023-07-31 ENCOUNTER — Other Ambulatory Visit

## 2023-07-31 ENCOUNTER — Ambulatory Visit (INDEPENDENT_AMBULATORY_CARE_PROVIDER_SITE_OTHER): Admitting: Obstetrics & Gynecology

## 2023-07-31 VITALS — BP 104/70 | HR 97 | Wt 190.0 lb

## 2023-07-31 DIAGNOSIS — Z1339 Encounter for screening examination for other mental health and behavioral disorders: Secondary | ICD-10-CM

## 2023-07-31 DIAGNOSIS — Z3A27 27 weeks gestation of pregnancy: Secondary | ICD-10-CM | POA: Diagnosis not present

## 2023-07-31 DIAGNOSIS — Z8759 Personal history of other complications of pregnancy, childbirth and the puerperium: Secondary | ICD-10-CM | POA: Diagnosis not present

## 2023-07-31 DIAGNOSIS — Z3480 Encounter for supervision of other normal pregnancy, unspecified trimester: Secondary | ICD-10-CM

## 2023-07-31 DIAGNOSIS — Z3483 Encounter for supervision of other normal pregnancy, third trimester: Secondary | ICD-10-CM

## 2023-07-31 NOTE — Progress Notes (Signed)
   PRENATAL VISIT NOTE  Subjective:  Joann Martinez is a 23 y.o. G2P1001 at [redacted]w[redacted]d being seen today for ongoing prenatal care.  She is currently monitored for the following issues for this high-risk pregnancy and has Palpitations; Sinus tachycardia; Supervision of other normal pregnancy, antepartum; and History of gestational hypertension on their problem list.  Patient reports no complaints.  Contractions: Not present. Vag. Bleeding: None.  Movement: Present. Denies leaking of fluid.   The following portions of the patient's history were reviewed and updated as appropriate: allergies, current medications, past family history, past medical history, past social history, past surgical history and problem list.   Objective:   Vitals:   07/31/23 0839  BP: 104/70  Pulse: 97  Weight: 190 lb (86.2 kg)     Fetal Status: Fetal Heart Rate (bpm): 143   Movement: Present      General: Alert, oriented and cooperative. Patient is in no acute distress.  Skin: Skin is warm and dry. No rash noted.   Cardiovascular: Normal heart rate noted  Respiratory: Normal respiratory effort, no problems with respiration noted  Abdomen: Soft, gravid, appropriate for gestational age.  Pain/Pressure: Absent     Pelvic: Cervical exam deferred        Extremities: Normal range of motion.  Edema: None  Mental Status: Normal mood and affect. Normal behavior. Normal judgment and thought content.   Assessment and Plan:  Pregnancy: G2P1001 at [redacted]w[redacted]d 1. Supervision of other normal pregnancy, antepartum (Primary)  - Glucose Tolerance, 2 Hours w/1 Hour - RPR - CBC - HIV antibody (with reflex)  2. [redacted] weeks gestation of pregnancy  - Glucose Tolerance, 2 Hours w/1 Hour - RPR - CBC - HIV antibody (with reflex)  3. History of gestational hypertension Normal BP   Preterm labor symptoms and general obstetric precautions including but not limited to vaginal bleeding, contractions, leaking of fluid and fetal  movement were reviewed in detail with the patient. Please refer to After Visit Summary for other counseling recommendations.   Return in about 4 weeks (around 08/28/2023).  Future Appointments  Date Time Provider Department Center  08/10/2023  3:15 PM [facility] NURSE [facility]  08/10/2023  3:30 PM [facility] US2 [facility]    Onnie Bilis, MD

## 2023-08-04 LAB — HIV ANTIBODY (ROUTINE TESTING W REFLEX): HIV Screen 4th Generation wRfx: NONREACTIVE

## 2023-08-04 LAB — RPR: RPR Ser Ql: NONREACTIVE

## 2023-08-04 LAB — CBC
Hematocrit: 34.1 % (ref 34.0–46.6)
Hemoglobin: 11.4 g/dL (ref 11.1–15.9)
MCH: 29.2 pg (ref 26.6–33.0)
MCHC: 33.4 g/dL (ref 31.5–35.7)
MCV: 87 fL (ref 79–97)
Platelets: 296 10*3/uL (ref 150–450)
RBC: 3.9 x10E6/uL (ref 3.77–5.28)
RDW: 13.5 % (ref 11.7–15.4)
WBC: 10.1 10*3/uL (ref 3.4–10.8)

## 2023-08-04 LAB — GLUCOSE TOLERANCE, 2 HOURS W/ 1HR
Glucose, 1 hour: 157 mg/dL (ref 70–179)
Glucose, 2 hour: 107 mg/dL (ref 70–152)
Glucose, Fasting: 79 mg/dL (ref 70–91)

## 2023-08-10 ENCOUNTER — Ambulatory Visit: Admitting: Obstetrics and Gynecology

## 2023-08-10 ENCOUNTER — Ambulatory Visit: Attending: Maternal & Fetal Medicine

## 2023-08-10 ENCOUNTER — Other Ambulatory Visit: Payer: Self-pay | Admitting: *Deleted

## 2023-08-10 DIAGNOSIS — Z3A28 28 weeks gestation of pregnancy: Secondary | ICD-10-CM | POA: Insufficient documentation

## 2023-08-10 DIAGNOSIS — O3663X0 Maternal care for excessive fetal growth, third trimester, not applicable or unspecified: Secondary | ICD-10-CM | POA: Insufficient documentation

## 2023-08-10 DIAGNOSIS — O09293 Supervision of pregnancy with other poor reproductive or obstetric history, third trimester: Secondary | ICD-10-CM

## 2023-08-10 DIAGNOSIS — Z8759 Personal history of other complications of pregnancy, childbirth and the puerperium: Secondary | ICD-10-CM | POA: Insufficient documentation

## 2023-08-10 DIAGNOSIS — O09292 Supervision of pregnancy with other poor reproductive or obstetric history, second trimester: Secondary | ICD-10-CM

## 2023-08-10 DIAGNOSIS — O3660X0 Maternal care for excessive fetal growth, unspecified trimester, not applicable or unspecified: Secondary | ICD-10-CM

## 2023-08-10 NOTE — Progress Notes (Signed)
 Maternal-Fetal Medicine Consultation Name: Joann Martinez MRN: [id number]  G2 P1001 at 28w 6d gestation. Patient is here for fetal growth assessment. She does not have gestational diabetes.  Ultrasound The estimated fetal weight and abdominal circumference measurements are at the 99th percentiles.  Amniotic fluid is normal good fetal activity seen.  Cephalic presentation.  I discussed ultrasound findings and the limitations of ultrasound to accurately estimate the fetal weight.  Obstetric history is significant for a term vaginal delivery of an infant weighing 8 pounds and 7 ounces at birth. I recommended follow-up ultrasound in 8 weeks to estimated fetal weight before delivery.  Recommendations An appointment was made for her to return in 8 weeks for fetal growth assessment.  Consultation including face-to-face (more than 50%) counseling 10 minutes.

## 2023-08-27 ENCOUNTER — Ambulatory Visit: Admitting: Obstetrics & Gynecology

## 2023-08-27 VITALS — BP 112/75 | HR 108 | Wt 202.0 lb

## 2023-08-27 DIAGNOSIS — Z8759 Personal history of other complications of pregnancy, childbirth and the puerperium: Secondary | ICD-10-CM | POA: Diagnosis not present

## 2023-08-27 DIAGNOSIS — Z3480 Encounter for supervision of other normal pregnancy, unspecified trimester: Secondary | ICD-10-CM

## 2023-08-27 DIAGNOSIS — Z3A31 31 weeks gestation of pregnancy: Secondary | ICD-10-CM

## 2023-08-27 DIAGNOSIS — O3660X0 Maternal care for excessive fetal growth, unspecified trimester, not applicable or unspecified: Secondary | ICD-10-CM | POA: Diagnosis not present

## 2023-08-27 NOTE — Progress Notes (Signed)
   PRENATAL VISIT NOTE  Subjective:  Joann Martinez is a 23 y.o. G2P1001 at [redacted]w[redacted]d being seen today for ongoing prenatal care.  She is currently monitored for the following issues for this high-risk pregnancy and has Palpitations; Sinus tachycardia; Supervision of other normal pregnancy, antepartum; History of gestational hypertension; and Large for gestational age fetus affecting management of mother, antepartum on their problem list.  Patient reports no complaints.  Contractions: Not present. Vag. Bleeding: None.  Movement: Present. Denies leaking of fluid.   The following portions of the patient's history were reviewed and updated as appropriate: allergies, current medications, past family history, past medical history, past social history, past surgical history and problem list.   Objective:    Vitals:   08/27/23 1429  BP: 112/75  Pulse: (!) 108  Weight: 202 lb (91.6 kg)    Fetal Status:  Fetal Heart Rate (bpm): 144   Movement: Present    General: Alert, oriented and cooperative. Patient is in no acute distress.  Skin: Skin is warm and dry. No rash noted.   Cardiovascular: Normal heart rate noted  Respiratory: Normal respiratory effort, no problems with respiration noted  Abdomen: Soft, gravid, appropriate for gestational age.  Pain/Pressure: Absent     Pelvic: Cervical exam deferred        Extremities: Normal range of motion.     Mental Status: Normal mood and affect. Normal behavior. Normal judgment and thought content.   Assessment and Plan:  Pregnancy: G2P1001 at [redacted]w[redacted]d 1. Supervision of other normal pregnancy, antepartum (Primary) Doing well  2. History of gestational hypertension Normal BP  Preterm labor symptoms and general obstetric precautions including but not limited to vaginal bleeding, contractions, leaking of fluid and fetal movement were reviewed in detail with the patient. Please refer to After Visit Summary for other counseling recommendations.    Return in about 2 weeks (around 09/10/2023).  Future Appointments  Date Time Provider Department Center  09/08/2023  2:30 PM Tresia Fruit, MD [facility] None  10/05/2023  3:00 PM [facility] PROVIDER 1 [facility]  10/05/2023  3:30 PM [facility] US3 [facility]    Onnie Bilis, MD

## 2023-09-08 ENCOUNTER — Encounter: Admitting: Obstetrics & Gynecology

## 2023-09-09 ENCOUNTER — Ambulatory Visit: Admitting: Obstetrics and Gynecology

## 2023-09-09 VITALS — BP 114/79 | HR 120 | Wt 208.0 lb

## 2023-09-09 DIAGNOSIS — R000 Tachycardia, unspecified: Secondary | ICD-10-CM

## 2023-09-09 DIAGNOSIS — Z8759 Personal history of other complications of pregnancy, childbirth and the puerperium: Secondary | ICD-10-CM | POA: Diagnosis not present

## 2023-09-09 DIAGNOSIS — Z3480 Encounter for supervision of other normal pregnancy, unspecified trimester: Secondary | ICD-10-CM

## 2023-09-09 DIAGNOSIS — O3660X0 Maternal care for excessive fetal growth, unspecified trimester, not applicable or unspecified: Secondary | ICD-10-CM | POA: Diagnosis not present

## 2023-09-09 DIAGNOSIS — Z3A33 33 weeks gestation of pregnancy: Secondary | ICD-10-CM | POA: Diagnosis not present

## 2023-09-09 NOTE — Progress Notes (Signed)
   PRENATAL VISIT NOTE  Subjective:  Joann Martinez is a 23 y.o. G2P1001 at [redacted]w[redacted]d being seen today for ongoing prenatal care.  She is currently monitored for the following issues for this low-risk pregnancy and has Palpitations; Sinus tachycardia; Supervision of other normal pregnancy, antepartum; History of gestational hypertension; and Large for gestational age fetus affecting management of mother, antepartum on their problem list.  Patient reports no complaints.  Contractions: Not present. Vag. Bleeding: None.  Movement: Present. Denies leaking of fluid.   The following portions of the patient's history were reviewed and updated as appropriate: allergies, current medications, past family history, past medical history, past social history, past surgical history and problem list.   Objective:   Vitals:   09/09/23 1313  BP: 114/79  Pulse: (!) 120  Weight: 208 lb (94.3 kg)   Body mass index is 33.57 kg/m. Total weight gain: 47 lb (21.3 kg)   Fetal Status: Fetal Heart Rate (bpm): 145   Movement: Present     General:  Alert, oriented and cooperative. Patient is in no acute distress.  Skin: Skin is warm and dry. No rash noted.   Cardiovascular: Normal heart rate noted  Respiratory: Normal respiratory effort, no problems with respiration noted  Abdomen: Soft, gravid, appropriate for gestational age.  Pain/Pressure: Absent     Pelvic: Cervical exam deferred        Extremities: Normal range of motion.     Mental Status: Normal mood and affect. Normal behavior. Normal judgment and thought content.   Assessment and Plan:  1. Supervision of other normal pregnancy, antepartum (Primary) Doing well, anticipatory guidance Plans pills for contraception  2. Excessive fetal growth affecting management of pregnancy, antepartum, single or unspecified fetus Repeat growth planned  3. History of gestational hypertension Normal blood pressure today  4. Sinus tachycardia Pulse 120,  will check labs and consider cardio referral prn  - TSH Rfx on Abnormal to Free T4 - CBC   Preterm labor symptoms and general obstetric precautions including but not limited to vaginal bleeding, contractions, leaking of fluid and fetal movement were reviewed in detail with the patient. Please refer to After Visit Summary for other counseling recommendations.   Return in about 2 weeks (around 09/23/2023).  Future Appointments  Date Time Provider Department Center  10/05/2023  3:00 PM [facility] PROVIDER 1 [facility]  10/05/2023  3:30 PM [facility] US3 [facility]    Marci Setter, MD

## 2023-09-09 NOTE — Patient Instructions (Signed)
Childbirth Education Options: [facility] Department Classes:  Childbirth education classes can help you get ready for a positive parenting experience. You can also meet other expectant parents and get free stuff for your baby. Each class runs for five weeks on the same night and costs $45 for the mother-to-be and her support person. Medicaid covers the cost if you are eligible. Call [phone] to register. [facility] Childbirth Education: Classes can vary in availability and schedule is subject to change. For most up-to-date information please visit [email or web address] to review and register.

## 2023-09-10 LAB — CBC
Hematocrit: 34.9 % (ref 34.0–46.6)
Hemoglobin: 11.8 g/dL (ref 11.1–15.9)
MCH: 28.7 pg (ref 26.6–33.0)
MCHC: 33.8 g/dL (ref 31.5–35.7)
MCV: 85 fL (ref 79–97)
Platelets: 292 10*3/uL (ref 150–450)
RBC: 4.11 x10E6/uL (ref 3.77–5.28)
RDW: 13.5 % (ref 11.7–15.4)
WBC: 8.6 10*3/uL (ref 3.4–10.8)

## 2023-09-10 LAB — TSH RFX ON ABNORMAL TO FREE T4: TSH: 1.19 u[IU]/mL (ref 0.450–4.500)

## 2023-09-11 ENCOUNTER — Ambulatory Visit: Payer: Self-pay | Admitting: Obstetrics and Gynecology

## 2023-09-11 DIAGNOSIS — R000 Tachycardia, unspecified: Secondary | ICD-10-CM

## 2023-09-23 ENCOUNTER — Ambulatory Visit: Payer: Self-pay | Admitting: Obstetrics & Gynecology

## 2023-09-23 VITALS — BP 111/79 | HR 118 | Wt 213.0 lb

## 2023-09-23 DIAGNOSIS — O3660X0 Maternal care for excessive fetal growth, unspecified trimester, not applicable or unspecified: Secondary | ICD-10-CM

## 2023-09-23 DIAGNOSIS — Z3480 Encounter for supervision of other normal pregnancy, unspecified trimester: Secondary | ICD-10-CM

## 2023-09-23 DIAGNOSIS — Z3A35 35 weeks gestation of pregnancy: Secondary | ICD-10-CM | POA: Diagnosis not present

## 2023-09-23 DIAGNOSIS — Z8759 Personal history of other complications of pregnancy, childbirth and the puerperium: Secondary | ICD-10-CM | POA: Diagnosis not present

## 2023-09-23 NOTE — Progress Notes (Signed)
   PRENATAL VISIT NOTE  Subjective:  Joann Martinez is a 23 y.o. G2P1001 at [redacted]w[redacted]d being seen today for ongoing prenatal care.  She is currently monitored for the following issues for this high-risk pregnancy and has Palpitations; Sinus tachycardia; Supervision of other normal pregnancy, antepartum; History of gestational hypertension; and Large for gestational age fetus affecting management of mother, antepartum on their problem list.  Patient reports no complaints.  Contractions: Not present. Vag. Bleeding: None.  Movement: Present. Denies leaking of fluid.   The following portions of the patient's history were reviewed and updated as appropriate: allergies, current medications, past family history, past medical history, past social history, past surgical history and problem list.   Objective:    Vitals:   09/23/23 1425  BP: 111/79  Pulse: (!) 118  Weight: 213 lb (96.6 kg)    Fetal Status:  Fetal Heart Rate (bpm): 145   Movement: Present    General: Alert, oriented and cooperative. Patient is in no acute distress.  Skin: Skin is warm and dry. No rash noted.   Cardiovascular: Normal heart rate noted  Respiratory: Normal respiratory effort, no problems with respiration noted  Abdomen: Soft, gravid, appropriate for gestational age.  Pain/Pressure: Present     Pelvic: Cervical exam deferred        Extremities: Normal range of motion.     Mental Status: Normal mood and affect. Normal behavior. Normal judgment and thought content.   Assessment and Plan:  Pregnancy: G2P1001 at [redacted]w[redacted]d There are no diagnoses linked to this encounter. Preterm labor symptoms and general obstetric precautions including but not limited to vaginal bleeding, contractions, leaking of fluid and fetal movement were reviewed in detail with the patient. Please refer to After Visit Summary for other counseling recommendations.  Supervision of other normal pregnancy, antepartum  History of gestational  hypertension  Excessive fetal growth affecting management of pregnancy, antepartum, single or unspecified fetus F/u US  10/05/23 Return in about 1 week (around 09/30/2023).  Future Appointments  Date Time Provider Department Center  10/02/2023 11:15 AM Eveline Lynwood MATSU, MD [facility] None  10/05/2023  3:00 PM [facility] PROVIDER 1 [facility]  10/05/2023  3:30 PM [facility] US3 [facility]    Lynwood Eveline, MD

## 2023-10-02 ENCOUNTER — Ambulatory Visit (INDEPENDENT_AMBULATORY_CARE_PROVIDER_SITE_OTHER): Admitting: Obstetrics & Gynecology

## 2023-10-02 ENCOUNTER — Other Ambulatory Visit (HOSPITAL_COMMUNITY)
Admission: RE | Admit: 2023-10-02 | Discharge: 2023-10-02 | Disposition: A | Discharge status: Home / Self Care | Source: Ambulatory Visit | Attending: Obstetrics & Gynecology | Admitting: Obstetrics & Gynecology

## 2023-10-02 VITALS — BP 118/83 | HR 96 | Wt 214.0 lb

## 2023-10-02 DIAGNOSIS — O3660X0 Maternal care for excessive fetal growth, unspecified trimester, not applicable or unspecified: Secondary | ICD-10-CM

## 2023-10-02 DIAGNOSIS — Z3480 Encounter for supervision of other normal pregnancy, unspecified trimester: Secondary | ICD-10-CM | POA: Diagnosis present

## 2023-10-02 DIAGNOSIS — Z1332 Encounter for screening for maternal depression: Secondary | ICD-10-CM | POA: Diagnosis not present

## 2023-10-02 DIAGNOSIS — Z3A36 36 weeks gestation of pregnancy: Secondary | ICD-10-CM

## 2023-10-02 DIAGNOSIS — Z3483 Encounter for supervision of other normal pregnancy, third trimester: Secondary | ICD-10-CM

## 2023-10-02 NOTE — Progress Notes (Signed)
ROB GBS 

## 2023-10-02 NOTE — Progress Notes (Signed)
   PRENATAL VISIT NOTE  Subjective:  Joann Martinez is a 23 y.o. G2P1001 at [redacted]w[redacted]d being seen today for ongoing prenatal care.  She is currently monitored for the following issues for this low-risk pregnancy and has Palpitations; Sinus tachycardia; Supervision of other normal pregnancy, antepartum; History of gestational hypertension; and Large for gestational age fetus affecting management of mother, antepartum on their problem list.  Patient reports occasional contractions.  Contractions: Irritability. Vag. Bleeding: None.  Movement: Present. Denies leaking of fluid.   The following portions of the patient's history were reviewed and updated as appropriate: allergies, current medications, past family history, past medical history, past social history, past surgical history and problem list.   Objective:    Vitals:   10/02/23 1128  BP: 118/83  Pulse: 96  Weight: 214 lb (97.1 kg)    Fetal Status:  Fetal Heart Rate (bpm): 137   Movement: Present Presentation: Vertex  General: Alert, oriented and cooperative. Patient is in no acute distress.  Skin: Skin is warm and dry. No rash noted.   Cardiovascular: Normal heart rate noted  Respiratory: Normal respiratory effort, no problems with respiration noted  Abdomen: Soft, gravid, appropriate for gestational age.  Pain/Pressure: Present     Pelvic: Cervical exam performed in the presence of a chaperone Dilation: Fingertip Effacement (%): 30 Station: -3  Extremities: Normal range of motion.  Edema: Trace  Mental Status: Normal mood and affect. Normal behavior. Normal judgment and thought content.   Assessment and Plan:  Pregnancy: G2P1001 at [redacted]w[redacted]d 1. Supervision of other normal pregnancy, antepartum (Primary)  - Culture, beta strep (group b only) - Cervicovaginal ancillary only( [place])  2. [redacted] weeks gestation of pregnancy  - Culture, beta strep (group b only) - Cervicovaginal ancillary only( [place])  3. Excessive  fetal growth affecting management of pregnancy, antepartum, single or unspecified fetus US  f/u in 3 days  Preterm labor symptoms and general obstetric precautions including but not limited to vaginal bleeding, contractions, leaking of fluid and fetal movement were reviewed in detail with the patient. Please refer to After Visit Summary for other counseling recommendations.   Return in about 1 week (around 10/09/2023).  Future Appointments  Date Time Provider Department Center  10/05/2023  3:00 PM [facility] PROVIDER 1 [facility]  10/05/2023  3:30 PM [facility] US3 [facility]    Lynwood Solomons, MD

## 2023-10-05 ENCOUNTER — Ambulatory Visit: Attending: Obstetrics and Gynecology

## 2023-10-05 ENCOUNTER — Ambulatory Visit (HOSPITAL_BASED_OUTPATIENT_CLINIC_OR_DEPARTMENT_OTHER): Admitting: Obstetrics

## 2023-10-05 VITALS — BP 131/83 | HR 73

## 2023-10-05 DIAGNOSIS — Z3A36 36 weeks gestation of pregnancy: Secondary | ICD-10-CM

## 2023-10-05 DIAGNOSIS — O10913 Unspecified pre-existing hypertension complicating pregnancy, third trimester: Secondary | ICD-10-CM | POA: Insufficient documentation

## 2023-10-05 DIAGNOSIS — Z3480 Encounter for supervision of other normal pregnancy, unspecified trimester: Secondary | ICD-10-CM

## 2023-10-05 DIAGNOSIS — O09293 Supervision of pregnancy with other poor reproductive or obstetric history, third trimester: Secondary | ICD-10-CM | POA: Diagnosis not present

## 2023-10-05 DIAGNOSIS — O3663X0 Maternal care for excessive fetal growth, third trimester, not applicable or unspecified: Secondary | ICD-10-CM | POA: Insufficient documentation

## 2023-10-05 DIAGNOSIS — O3660X0 Maternal care for excessive fetal growth, unspecified trimester, not applicable or unspecified: Secondary | ICD-10-CM | POA: Diagnosis present

## 2023-10-05 LAB — CERVICOVAGINAL ANCILLARY ONLY
Chlamydia: NEGATIVE
Comment: NEGATIVE
Comment: NORMAL
Neisseria Gonorrhea: NEGATIVE

## 2023-10-05 NOTE — Progress Notes (Signed)
 MFM Consult Note  Joann Martinez is currently at 36 weeks and 6 days.  She has been followed due to a large for gestational age fetus noted during her prior ultrasound exams.  She denies any problems since her last exam and has screened negative for gestational diabetes in her current pregnancy.  Her first child was delivered 3 years ago at term weighing 8 pounds 8 ounces.  She reports that she was able to deliver that child without any difficulty.  On today's exam the overall EFW of 7 pounds 11 ounces measures at the 89th percentile for her gestational age.    There was normal amniotic fluid noted with a total AFI of 18.83 cm.    The fetus was in the vertex presentation.  The patient was reassured that based on the EFW obtained today, this child will be of similar weight to her first child should she deliver around her due date.    No further exams were scheduled in our office.    The patient stated that all of her questions were answered today.  A total of 20 minutes was spent counseling and coordinating the care for this patient.  Greater than 50% of the time was spent in direct face-to-face contact.

## 2023-10-06 LAB — CULTURE, BETA STREP (GROUP B ONLY): Strep Gp B Culture: NEGATIVE

## 2023-10-09 ENCOUNTER — Ambulatory Visit (INDEPENDENT_AMBULATORY_CARE_PROVIDER_SITE_OTHER): Admitting: Certified Nurse Midwife

## 2023-10-09 ENCOUNTER — Encounter: Payer: Self-pay | Admitting: Certified Nurse Midwife

## 2023-10-09 VITALS — BP 120/78 | HR 82 | Wt 221.6 lb

## 2023-10-09 DIAGNOSIS — Z3A37 37 weeks gestation of pregnancy: Secondary | ICD-10-CM

## 2023-10-09 DIAGNOSIS — Z3403 Encounter for supervision of normal first pregnancy, third trimester: Secondary | ICD-10-CM

## 2023-10-09 DIAGNOSIS — O3663X0 Maternal care for excessive fetal growth, third trimester, not applicable or unspecified: Secondary | ICD-10-CM | POA: Diagnosis not present

## 2023-10-09 DIAGNOSIS — O3660X0 Maternal care for excessive fetal growth, unspecified trimester, not applicable or unspecified: Secondary | ICD-10-CM

## 2023-10-09 NOTE — Progress Notes (Signed)
 Pt presents for ROB visit. No concerns

## 2023-10-10 NOTE — Progress Notes (Signed)
   PRENATAL VISIT NOTE  Subjective:  Joann Martinez is a 23 y.o. G2P1001 at [redacted]w[redacted]d being seen today for ongoing prenatal care.  She is currently monitored for the following issues for this low-risk pregnancy and has Palpitations; Sinus tachycardia; Supervision of other normal pregnancy, antepartum; History of gestational hypertension; and Large for gestational age fetus affecting management of mother, antepartum on their problem list.  Patient reports no complaints.  Contractions: Not present. Vag. Bleeding: None.  Movement: Present. Denies leaking of fluid.   The following portions of the patient's history were reviewed and updated as appropriate: allergies, current medications, past family history, past medical history, past social history, past surgical history and problem list.   Objective:    Vitals:   10/09/23 1133  BP: 120/78  Pulse: 82  Weight: 221 lb 9.6 oz (100.5 kg)    Fetal Status:  Fetal Heart Rate (bpm): 140   Movement: Present    General: Alert, oriented and cooperative. Patient is in no acute distress.  Skin: Skin is warm and dry. No rash noted.   Cardiovascular: Normal heart rate noted  Respiratory: Normal respiratory effort, no problems with respiration noted  Abdomen: Soft, gravid, appropriate for gestational age.  Pain/Pressure: Present     Pelvic: Cervical exam deferred        Extremities: Normal range of motion.  Edema: Trace  Mental Status: Normal mood and affect. Normal behavior. Normal judgment and thought content.   Assessment and Plan:  Pregnancy: G2P1001 at [redacted]w[redacted]d 1. Encounter for supervision of low-risk first pregnancy in third trimester (Primary) - Patient doing well.  - Reports vigorous and frequent fetal movement   2. [redacted] weeks gestation of pregnancy - reviewed signs and symptoms of labor   3. Excessive fetal growth affecting management of pregnancy, antepartum, single or unspecified fetus - Based on last US , fetal size likely similar to  her last baby who delivered vaginally without difficulty.   Term labor symptoms and general obstetric precautions including but not limited to vaginal bleeding, contractions, leaking of fluid and fetal movement were reviewed in detail with the patient. Please refer to After Visit Summary for other counseling recommendations.   Return in about 1 week (around 10/16/2023) for LOB.  Future Appointments  Date Time Provider Department Center  10/16/2023  9:15 AM Davis, Devon E, PA-C [facility] None    Joann Vecchio (Claris) Emilio, MSN, [facility]  Center for [facility]  10/10/2023 4:17 PM

## 2023-10-11 ENCOUNTER — Encounter: Payer: Self-pay | Admitting: Certified Nurse Midwife

## 2023-10-15 NOTE — Progress Notes (Unsigned)
   PRENATAL VISIT NOTE  Subjective:  Joann Martinez is a 23 y.o. G2P1001 at [redacted]w[redacted]d being seen today for ongoing prenatal care.  She is currently monitored for the following issues for this high-risk pregnancy and has Palpitations; Sinus tachycardia; Supervision of other normal pregnancy, antepartum; History of gestational hypertension; and Large for gestational age fetus affecting management of mother, antepartum on their problem list.  Patient reports no complaints.  Contractions: Irritability. Vag. Bleeding: None.  Movement: Increased. Denies leaking of fluid.   The following portions of the patient's history were reviewed and updated as appropriate: allergies, current medications, past family history, past medical history, past social history, past surgical history and problem list.   Objective:    Vitals:   10/16/23 0912 10/16/23 0923  BP: (!) 129/92 126/88  Pulse: (!) 119 (!) 116  Weight: 224 lb 3.2 oz (101.7 kg)     Fetal Status:  Fetal Heart Rate (bpm): 141 Fundal Height: 37 cm Movement: Increased    General: Alert, oriented and cooperative. Patient is in no acute distress.  Skin: Skin is warm and dry. No rash noted.   Cardiovascular: Normal heart rate noted  Respiratory: Normal respiratory effort, no problems with respiration noted  Abdomen: Soft, gravid, appropriate for gestational age.  Pain/Pressure: Present     Pelvic: Cervical exam deferred        Extremities: Normal range of motion.  Edema: Trace  Mental Status: Normal mood and affect. Normal behavior. Normal judgment and thought content.   Assessment and Plan:  Pregnancy: G2P1001 at [redacted]w[redacted]d  1. Supervision of other normal pregnancy, antepartum (Primary) Patient doing well, feeling regular fetal movement BP, FHR, FH appropriate  2. [redacted] weeks gestation of pregnancy Anticipatory guidance about next visit/weeks of pregnancy given  3. Excessive fetal growth affecting management of pregnancy, antepartum, single  or unspecified fetus 10/05/2023 EFW 3475 gm (89 %)   4. History of gestational hypertension Normotensive today Continue to monitor  Term labor symptoms and general obstetric precautions including but not limited to vaginal bleeding, contractions, leaking of fluid and fetal movement were reviewed in detail with the patient.  Please refer to After Visit Summary for other counseling recommendations.   No follow-ups on file.  Future Appointments  Date Time Provider Department Center  10/23/2023 10:35 AM Jhonny Augustin BROCKS, MD [facility] None    Jorene FORBES Moats, PA-C

## 2023-10-16 ENCOUNTER — Encounter: Payer: Self-pay | Admitting: Physician Assistant

## 2023-10-16 ENCOUNTER — Ambulatory Visit (INDEPENDENT_AMBULATORY_CARE_PROVIDER_SITE_OTHER): Admitting: Physician Assistant

## 2023-10-16 VITALS — BP 126/88 | HR 116 | Wt 224.2 lb

## 2023-10-16 DIAGNOSIS — O3660X0 Maternal care for excessive fetal growth, unspecified trimester, not applicable or unspecified: Secondary | ICD-10-CM

## 2023-10-16 DIAGNOSIS — Z3480 Encounter for supervision of other normal pregnancy, unspecified trimester: Secondary | ICD-10-CM

## 2023-10-16 DIAGNOSIS — Z3A38 38 weeks gestation of pregnancy: Secondary | ICD-10-CM

## 2023-10-16 DIAGNOSIS — O3663X1 Maternal care for excessive fetal growth, third trimester, fetus 1: Secondary | ICD-10-CM

## 2023-10-16 DIAGNOSIS — Z8759 Personal history of other complications of pregnancy, childbirth and the puerperium: Secondary | ICD-10-CM

## 2023-10-16 NOTE — Progress Notes (Signed)
 Pt has no concerns at this time.

## 2023-10-23 ENCOUNTER — Other Ambulatory Visit: Payer: Self-pay

## 2023-10-23 ENCOUNTER — Encounter (HOSPITAL_COMMUNITY): Payer: Self-pay | Admitting: Obstetrics & Gynecology

## 2023-10-23 ENCOUNTER — Inpatient Hospital Stay (HOSPITAL_COMMUNITY)
Admission: AD | Admit: 2023-10-23 | Discharge: 2023-10-25 | DRG: 807 | Disposition: A | Discharge status: Home / Self Care | Attending: Obstetrics and Gynecology | Admitting: Obstetrics and Gynecology

## 2023-10-23 ENCOUNTER — Ambulatory Visit: Admitting: Family Medicine

## 2023-10-23 VITALS — BP 125/90 | HR 76 | Wt 225.9 lb

## 2023-10-23 DIAGNOSIS — O0993 Supervision of high risk pregnancy, unspecified, third trimester: Secondary | ICD-10-CM | POA: Diagnosis not present

## 2023-10-23 DIAGNOSIS — O133 Gestational [pregnancy-induced] hypertension without significant proteinuria, third trimester: Secondary | ICD-10-CM

## 2023-10-23 DIAGNOSIS — Z3490 Encounter for supervision of normal pregnancy, unspecified, unspecified trimester: Secondary | ICD-10-CM | POA: Diagnosis present

## 2023-10-23 DIAGNOSIS — O3663X0 Maternal care for excessive fetal growth, third trimester, not applicable or unspecified: Secondary | ICD-10-CM | POA: Diagnosis not present

## 2023-10-23 DIAGNOSIS — Z79899 Other long term (current) drug therapy: Secondary | ICD-10-CM | POA: Diagnosis not present

## 2023-10-23 DIAGNOSIS — O134 Gestational [pregnancy-induced] hypertension without significant proteinuria, complicating childbirth: Secondary | ICD-10-CM | POA: Diagnosis present

## 2023-10-23 DIAGNOSIS — Z7982 Long term (current) use of aspirin: Secondary | ICD-10-CM

## 2023-10-23 DIAGNOSIS — O3660X0 Maternal care for excessive fetal growth, unspecified trimester, not applicable or unspecified: Secondary | ICD-10-CM

## 2023-10-23 DIAGNOSIS — Z3A39 39 weeks gestation of pregnancy: Secondary | ICD-10-CM

## 2023-10-23 DIAGNOSIS — Z3480 Encounter for supervision of other normal pregnancy, unspecified trimester: Principal | ICD-10-CM

## 2023-10-23 DIAGNOSIS — Z8759 Personal history of other complications of pregnancy, childbirth and the puerperium: Secondary | ICD-10-CM | POA: Diagnosis not present

## 2023-10-23 DIAGNOSIS — O139 Gestational [pregnancy-induced] hypertension without significant proteinuria, unspecified trimester: Secondary | ICD-10-CM | POA: Diagnosis present

## 2023-10-23 LAB — URINALYSIS, ROUTINE W REFLEX MICROSCOPIC
Bilirubin Urine: NEGATIVE
Glucose, UA: NEGATIVE mg/dL
Hgb urine dipstick: NEGATIVE
Ketones, ur: NEGATIVE mg/dL
Nitrite: NEGATIVE
Protein, ur: NEGATIVE mg/dL
Specific Gravity, Urine: 1.013 (ref 1.005–1.030)
pH: 6 (ref 5.0–8.0)

## 2023-10-23 LAB — CBC
HCT: 36.3 % (ref 36.0–46.0)
Hemoglobin: 12.1 g/dL (ref 12.0–15.0)
MCH: 27.2 pg (ref 26.0–34.0)
MCHC: 33.3 g/dL (ref 30.0–36.0)
MCV: 81.6 fL (ref 80.0–100.0)
Platelets: 241 K/uL (ref 150–400)
RBC: 4.45 MIL/uL (ref 3.87–5.11)
RDW: 15.8 % — ABNORMAL HIGH (ref 11.5–15.5)
WBC: 8.6 K/uL (ref 4.0–10.5)
nRBC: 0.2 % (ref 0.0–0.2)

## 2023-10-23 LAB — COMPREHENSIVE METABOLIC PANEL WITH GFR
ALT: 15 U/L (ref 0–44)
AST: 20 U/L (ref 15–41)
Albumin: 2.7 g/dL — ABNORMAL LOW (ref 3.5–5.0)
Alkaline Phosphatase: 171 U/L — ABNORMAL HIGH (ref 38–126)
Anion gap: 7 (ref 5–15)
BUN: 13 mg/dL (ref 6–20)
CO2: 19 mmol/L — ABNORMAL LOW (ref 22–32)
Calcium: 8.5 mg/dL — ABNORMAL LOW (ref 8.9–10.3)
Chloride: 110 mmol/L (ref 98–111)
Creatinine, Ser: 0.8 mg/dL (ref 0.44–1.00)
GFR, Estimated: >60 mL/min (ref >60)
Glucose, Bld: 84 mg/dL (ref 70–99)
Potassium: 4 mmol/L (ref 3.5–5.1)
Sodium: 136 mmol/L (ref 135–145)
Total Bilirubin: 0.3 mg/dL (ref 0.0–1.2)
Total Protein: 6.7 g/dL (ref 6.5–8.1)

## 2023-10-23 LAB — PROTEIN / CREATININE RATIO, URINE
Creatinine, Urine: 84 mg/dL
Protein Creatinine Ratio: 0.18 mg/mg{creat} — ABNORMAL HIGH (ref 0.00–0.15)
Total Protein, Urine: 15 mg/dL

## 2023-10-23 LAB — TYPE AND SCREEN
ABO/RH(D): O POS
Antibody Screen: NEGATIVE

## 2023-10-23 MED ORDER — ONDANSETRON HCL 4 MG/2ML IJ SOLN: 4.0000 mg | Freq: Four times a day (QID) | INTRAMUSCULAR | Status: DC | PRN

## 2023-10-23 MED ORDER — OXYTOCIN BOLUS FROM INFUSION
333.0000 mL | Freq: Once | INTRAVENOUS | Status: AC
  Administered 2023-10-24: 333 mL via INTRAVENOUS

## 2023-10-23 MED ORDER — OXYCODONE-ACETAMINOPHEN 5-325 MG PO TABS: 2.0000 | ORAL_TABLET | ORAL | Status: DC | PRN

## 2023-10-23 MED ORDER — ACETAMINOPHEN 325 MG PO TABS: 650.0000 mg | ORAL_TABLET | ORAL | Status: DC | PRN

## 2023-10-23 MED ORDER — TERBUTALINE SULFATE 1 MG/ML IJ SOLN: 0.2500 mg | Freq: Once | INTRAMUSCULAR | Status: DC | PRN

## 2023-10-23 MED ORDER — SOD CITRATE-CITRIC ACID 500-334 MG/5ML PO SOLN: 30.0000 mL | ORAL | Status: DC | PRN

## 2023-10-23 MED ORDER — LACTATED RINGERS IV SOLN: 500.0000 mL | INTRAVENOUS | Status: DC | PRN

## 2023-10-23 MED ORDER — LACTATED RINGERS IV SOLN: INTRAVENOUS | Status: DC

## 2023-10-23 MED ORDER — OXYTOCIN-SODIUM CHLORIDE 30-0.9 UT/500ML-% IV SOLN
1.0000 m[IU]/min | INTRAVENOUS | Status: DC
  Administered 2023-10-23: 2 m[IU]/min via INTRAVENOUS

## 2023-10-23 MED ORDER — FENTANYL CITRATE (PF) 100 MCG/2ML IJ SOLN
50.0000 ug | INTRAMUSCULAR | Status: DC | PRN
  Administered 2023-10-24: 100 ug via INTRAVENOUS
  Filled 2023-10-23: qty 2

## 2023-10-23 MED ORDER — LIDOCAINE HCL (PF) 1 % IJ SOLN: 30.0000 mL | INTRAMUSCULAR | Status: DC | PRN

## 2023-10-23 MED ORDER — ACETAMINOPHEN-CAFFEINE 500-65 MG PO TABS: 2.0000 | ORAL_TABLET | Freq: Once | ORAL | Status: DC

## 2023-10-23 MED ORDER — OXYTOCIN-SODIUM CHLORIDE 30-0.9 UT/500ML-% IV SOLN
2.5000 [IU]/h | INTRAVENOUS | Status: DC
  Administered 2023-10-24: 2.5 [IU]/h via INTRAVENOUS
  Filled 2023-10-23: qty 500

## 2023-10-23 MED ORDER — OXYCODONE-ACETAMINOPHEN 5-325 MG PO TABS: 1.0000 | ORAL_TABLET | ORAL | Status: DC | PRN

## 2023-10-23 MED ORDER — MISOPROSTOL 25 MCG QUARTER TABLET
25.0000 ug | ORAL_TABLET | ORAL | Status: DC | PRN
  Administered 2023-10-23: 25 ug via VAGINAL
  Filled 2023-10-23: qty 1

## 2023-10-23 NOTE — Progress Notes (Signed)
 Pt presents for rob. Pt has no questions or concerns at this time.

## 2023-10-23 NOTE — Progress Notes (Signed)
 LABOR PROGRESS NOTE  Patient Name: Joann Martinez, female   DOB: [birth year], 23 y.o.  MRN: [id number]  In to introduce myself to pt. FB out. Discussed role of pitocin  to improve fetal station to make AROM safe; pt agreeable.   Blood pressure (!) 134/92, pulse 79, temperature 97.7 F (36.5 C), temperature source Oral, resp. rate 17, height 5' 6 (1.676 m), weight 102.9 kg, last menstrual period 01/26/2023, SpO2 99%, unknown if currently breastfeeding.  Dilation: 4.5 Effacement (%): 50 Cervical Position: Posterior Station: -3 Presentation: Vertex Exam by:: Diana Ansah-Mensah, rnc  EFM: baseline 140, accels, no decels, moderate variability TOCO: q2-29min contractions  Start pitocin , titrate 2x2 Reassess in 4 hours for AROM  Mardy Shropshire, MD

## 2023-10-23 NOTE — Progress Notes (Signed)
   PRENATAL VISIT NOTE  Subjective:  Joann Martinez is a 23 y.o. G2P1001 at [redacted]w[redacted]d being seen today for ongoing prenatal care.  She is currently monitored for the following issues for this high-risk pregnancy and has Palpitations; Sinus tachycardia; Supervision of other normal pregnancy, antepartum; History of gestational hypertension; Large for gestational age fetus affecting management of mother, antepartum; and Encounter for induction of labor on their problem list.  Patient doing well with no acute concerns today. She reports increased b/l lower extremity swelling that doesn't improve with rest / elevation.  Denies HA, CP, SOB, RUQ pain.  Contractions: Irritability. Vag. Bleeding: None.  Movement: Present. Denies leaking of fluid.   The following portions of the patient's history were reviewed and updated as appropriate: allergies, current medications, past family history, past medical history, past social history, past surgical history and problem list. Problem list updated.  Objective:   Vitals:   10/23/23 1101  BP: (!) 125/90  Pulse: 76  Weight: 225 lb 14.4 oz (102.5 kg)    Fetal Status: Fetal Heart Rate (bpm): 140   Movement: Present     General:  Alert, oriented and cooperative. Patient is in no acute distress.  Skin: Skin is warm and dry. No rash noted.   Cardiovascular: Normal heart rate noted  Respiratory: Normal respiratory effort, no problems with respiration noted  Abdomen: Soft, gravid, appropriate for gestational age.  Pain/Pressure: Present     Pelvic: Cervical exam performed Dilation: Fingertip Effacement (%): 50 Station: -3  Extremities: Normal range of motion.  Edema: Trace (feet)  Mental Status:  Normal mood and affect. Normal behavior. Normal judgment and thought content.   Assessment and Plan:  Pregnancy: G2P1001 at [redacted]w[redacted]d  1. [redacted] weeks gestation of pregnancy (Primary) 2. High-risk pregnancy in third trimester   3. Excessive fetal growth affecting  management of pregnancy, antepartum, single or unspecified fetus   4. History of gestational hypertension 5. Gestational hypertension, third trimester Pt with elevated DBP again today and sent to MAU after CVE and BSUS showing 0.5/50/-3, vertex given again elevated DBP in s/o worsening swelling; sending to MAU for NST and PEC labs as no space for direct admit so set up IOL for 8/2 day in case reassuring work up   Term labor symptoms and general obstetric precautions including but not limited to vaginal bleeding, contractions, leaking of fluid and fetal movement were reviewed in detail with the patient.  Please refer to After Visit Summary for other counseling recommendations.   Return in about 1 week (around 10/30/2023) for [facility] f/u with NST .   Augustin Slade, MD Family Medicine - Obstetrics Fellow

## 2023-10-23 NOTE — MAU Note (Signed)
 Joann Martinez is a 23 y.o. at [redacted]w[redacted]d here in MAU reporting: sent from [facility] office because my blood pressure is high.  Denies current HA, visual disturbances, and epigastric pain.  Reports see stars at times.  Denies VB or LOF.  Reports +FM.  LMP: 01/26/2023 Onset of complaint: today Pain score: 0 Vitals:   10/23/23 1213  BP: (!) 136/100  Pulse: 78  Resp: 18  Temp: 98 F (36.7 C)  SpO2: 99%     FHT: 145 bpm  Lab orders placed from triage: UA

## 2023-10-23 NOTE — H&P (Signed)
 OBSTETRIC ADMISSION HISTORY AND PHYSICAL  Joann Martinez is a 23 y.o. female G2P1001 with IUP at [redacted]w[redacted]d by US  presenting for IOL newly dx'ed gHTN in s/o LGA. She reports +FMs, No LOF, no VB, no blurry vision, headaches or peripheral edema, and RUQ pain.  She plans on breast feeding. She's unsure for birth control. She received her prenatal care at [facility]   Dating: By US  --->  Estimated Date of Delivery: 10/27/23  Sono:    NURSING  PROVIDER  Office Location [facility] Dating by LMP c/w U/S at 10 wks  [facility] Model Traditional Anatomy U/S    Initiated care at  [employer]  English               LAB RESULTS   Support Person  FOB Genetics NIPS: LR female AFP:       NT/IT (FT only)        Carrier Screen Horizon: 14/14 negative in 2021  Rhogam  O/Positive/-- (01/15 1126) A1C/GTT Early HgbA1C:  Third trimester 2 hr GTT: normal 28 weeks  Flu Vaccine  No      TDaP Vaccine  DECLINED 07/31/23 Blood Type O/Positive/-- (01/15 1126)  RSV Vaccine   Antibody Negative (01/15 1126)  COVID Vaccine  Yes Rubella <0.90 (01/15 1126)  Feeding Plan breast RPR Non Reactive (05/09 0821)  Contraception Undecided/ OCP HBsAg Negative (01/15 1126)  Circumcision No HIV Non Reactive (05/09 0821)  Pediatrician   [facility] HCVAb Non Reactive (01/15 1126)  Prenatal Classes        BTL Consent   Pap       Diagnosis  Date Value Ref Range Status  05/28/2023     Final    - Negative for intraepithelial lesion or malignancy (NILM)    BTL Pre-payment   GC/CT Initial:   36wks:    VBAC Consent   GBS For PCN allergy, check sensitivities   BRx Optimized? [ ]  yes   [X]  no      DME Rx [X]  BP cuff [ ]  Weight Scale Waterbirth  [ ]  Class [ ]  Consent [ ]  CNM visit  PHQ9 & GAD7 [X]  new OB [X]  28 weeks  [X]  36 weeks Induction  [ ]  Orders Entered [ ] Foley Y/N     Prenatal History/Complications:  Patient Active Problem List   Diagnosis Date Noted   Large for gestational age fetus affecting  management of mother, antepartum 08/27/2023   History of gestational hypertension 06/01/2023   Supervision of other normal pregnancy, antepartum 04/08/2023   Palpitations 02/18/2021   Sinus tachycardia 02/18/2021   NURSING  PROVIDER  Office Location [facility] Dating by LMP c/w U/S at 10 wks  [facility] Model Traditional Anatomy U/S    Initiated care at  [employer] [facility]  English               LAB RESULTS   Support Person  FOB Genetics NIPS: LR female AFP:       NT/IT (FT only)        Carrier Screen Horizon: 14/14 negative in 2021  Rhogam  O/Positive/-- (01/15 1126) A1C/GTT Early HgbA1C:  Third trimester 2 hr GTT: normal 28 weeks  Flu Vaccine  No      TDaP Vaccine  DECLINED 07/31/23 Blood Type O/Positive/-- (01/15 1126)  RSV  Vaccine   Antibody Negative (01/15 1126)  COVID Vaccine  Yes Rubella <0.90 (01/15 1126)  Feeding Plan breast RPR Non Reactive (05/09 0821)  Contraception Undecided/ OCP HBsAg Negative (01/15 1126)  Circumcision No HIV Non Reactive (05/09 0821)  Pediatrician   [facility] HCVAb Non Reactive (01/15 1126)  Prenatal Classes        BTL Consent   Pap       Diagnosis  Date Value Ref Range Status  05/28/2023     Final    - Negative for intraepithelial lesion or malignancy (NILM)    BTL Pre-payment   GC/CT Initial:   36wks:    VBAC Consent   GBS For PCN allergy, check sensitivities   BRx Optimized? [ ]  yes   [X]  no      DME Rx [X]  BP cuff [ ]  Weight Scale Waterbirth  [ ]  Class [ ]  Consent [ ]  CNM visit  PHQ9 & GAD7 [X]  new OB [X]  28 weeks  [X]  36 weeks Induction  [ ]  Orders Entered [ ] Foley Y/N    Past Medical History: Past Medical History:  Diagnosis Date   Pregnancy induced hypertension     Past Surgical History: Past Surgical History:  Procedure Laterality Date   NO PAST SURGERIES      Obstetrical History: OB History     Gravida  2   Para  1   Term  1   Preterm  0   AB  0   Living  1      SAB  0   IAB  0   Ectopic   0   Multiple  0   Live Births  1           Social History Social History   Socioeconomic History   Marital status: Single    Spouse name: Not on file   Number of children: Not on file   Years of education: Not on file   Highest education level: Not on file  Occupational History   Occupation: [employer]  Tobacco Use   Smoking status: Never   Smokeless tobacco: Never  Vaping Use   Vaping status: Never Used  Substance and Sexual Activity   Alcohol use: Never   Drug use: Never   Sexual activity: Yes    Birth control/protection: None  Other Topics Concern   Not on file  Social History Narrative   Not on file   Social Drivers of Health   Financial Resource Strain: Not on file  Food Insecurity: No Food Insecurity (01/24/2021)   [facility]   Hunger Vital Sign    Within the past 12 months, you worried that your food would run out before you got the money to buy more.: Never true    Within the past 12 months, the food you bought just didn't last and you didn't have money to get more.: Never true  Transportation Needs: Not on file  Physical Activity: Not on file  Stress: Not on file  Social Connections: Unknown (08/03/2021)   Received from [facility]   Social Network    Social Network: Not on file    Family History: Family History  Problem Relation Age of Onset   Healthy Mother    Healthy Father     Allergies: No Known Allergies  Medications Prior to Admission  Medication Sig Dispense Refill Last Dose/Taking   aspirin  EC 81 MG tablet Take 1 tablet (81 mg total) by  mouth daily. Swallow whole. 30 tablet 12 10/22/2023   Prenat-Fe Poly-Methfol-FA-DHA (VITAFOL  ULTRA) 29-0.6-0.4-200 MG CAPS Take 1 capsule by mouth daily before breakfast. 90 capsule 4 10/22/2023   acetaminophen  (TYLENOL ) 500 MG tablet Take 2 tablets (1,000 mg total) by mouth every 6 (six) hours as needed (for pain scale < 4). (Patient not taking: Reported on 10/23/2023)      Blood  Pressure Monitoring (BLOOD PRESSURE KIT) DEVI 1 kit by Does not apply route once a week. Check Blood Pressure regularly and record readings into the Babyscripts App.  Large Cuff.  DX O90.0 (Patient not taking: Reported on 10/23/2023) 1 each 0    Blood Pressure Monitoring (BLOOD PRESSURE KIT) DEVI 1 Device by Does not apply route once a week. (Patient not taking: Reported on 10/23/2023) 1 each 0    metroNIDAZOLE  (FLAGYL ) 500 MG tablet Take 1 tablet (500 mg total) by mouth 2 (two) times daily. (Patient not taking: Reported on 10/23/2023) 14 tablet 0      Review of Systems   All systems reviewed and negative except as stated in HPI  Blood pressure 119/85, pulse (!) 102, temperature 98 F (36.7 C), temperature source Oral, resp. rate 18, height 5' 6 (1.676 m), weight 102.9 kg, last menstrual period 01/26/2023, SpO2 98%, unknown if currently breastfeeding. General appearance: alert, cooperative, and appears stated age Lungs: clear to auscultation bilaterally Heart: regular rate and rhythm Abdomen: soft, non-tender; bowel sounds normal Pelvic: normal female genitalia  Extremities: Homans sign is negative, no sign of DVT Presentation: cephalic Fetal monitoringBaseline: 135 bpm, Variability: Good {> 6 bpm), Accelerations: Reactive, and Decelerations: Absent Uterine activity q61mins     Prenatal labs: ABO, Rh: O/Positive/-- (01/15 1126) Antibody: Negative (01/15 1126) Rubella: <0.90 (01/15 1126) RPR: Non Reactive (05/09 0821)  HBsAg: Negative (01/15 1126)  HIV: Non Reactive (05/09 0821)  GBS: Negative/-- (07/11 1146)    Lab Results  Component Value Date   GBS Negative 10/02/2023   GTT nrl Genetic screening  LR female, Horizon neg 14/14 (2021), AFP neg Anatomy US  LGA  Immunization History  Administered Date(s) Administered   Influenza,inj,Quad PF,6+ Mos 04/24/2022    Prenatal Transfer Tool  Maternal Diabetes: No Genetic Screening: Normal Maternal Ultrasounds/Referrals: Other:LGA Fetal  Ultrasounds or other Referrals:  Referred to Materal Fetal Medicine  Maternal Substance Abuse:  No Significant Maternal Medications:  None Significant Maternal Lab Results: Group B Strep negative Number of Prenatal Visits:greater than 3 verified prenatal visits Maternal Vaccinations:none Other Comments:  None   Results for orders placed or performed during the hospital encounter of 10/23/23 (from the past 24 hours)  Protein / creatinine ratio, urine   Collection Time: 10/23/23 12:25 PM  Result Value Ref Range   Creatinine, Urine 84 mg/dL   Total Protein, Urine 15 mg/dL   Protein Creatinine Ratio 0.18 (H) 0.00 - 0.15 mg/mg[Cre]  Urinalysis, Routine w reflex microscopic -Urine, Clean Catch   Collection Time: 10/23/23 12:25 PM  Result Value Ref Range   Color, Urine YELLOW YELLOW   APPearance HAZY (A) CLEAR   Specific Gravity, Urine 1.013 1.005 - 1.030   pH 6.0 5.0 - 8.0   Glucose, UA NEGATIVE NEGATIVE mg/dL   Hgb urine dipstick NEGATIVE NEGATIVE   Bilirubin Urine NEGATIVE NEGATIVE   Ketones, ur NEGATIVE NEGATIVE mg/dL   Protein, ur NEGATIVE NEGATIVE mg/dL   Nitrite NEGATIVE NEGATIVE   Leukocytes,Ua TRACE (A) NEGATIVE   RBC / HPF 0-5 0 - 5 RBC/hpf   WBC, UA 0-5 0 -  5 WBC/hpf   Bacteria, UA RARE (A) NONE SEEN   Squamous Epithelial / HPF 0-5 0 - 5 /HPF  CBC   Collection Time: 10/23/23 12:40 PM  Result Value Ref Range   WBC 8.6 4.0 - 10.5 K/uL   RBC 4.45 3.87 - 5.11 MIL/uL   Hemoglobin 12.1 12.0 - 15.0 g/dL   HCT 63.6 63.9 - 53.9 %   MCV 81.6 80.0 - 100.0 fL   MCH 27.2 26.0 - 34.0 pg   MCHC 33.3 30.0 - 36.0 g/dL   RDW 84.1 (H) 88.4 - 84.4 %   Platelets 241 150 - 400 K/uL   nRBC 0.2 0.0 - 0.2 %    Patient Active Problem List   Diagnosis Date Noted   Large for gestational age fetus affecting management of mother, antepartum 08/27/2023   History of gestational hypertension 06/01/2023   Supervision of other normal pregnancy, antepartum 04/08/2023   Palpitations 02/18/2021    Sinus tachycardia 02/18/2021    Assessment/Plan:  Braniya Farrugia is a 23 y.o. G2P1001 at 108w3d here for IOL gHTN  #Labor:Cyto vs Cyto/FB #Pain: Per patient request  #FWB: Cat 1 #GBS status:  negative #Feeding: Breastmilk  #Reproductive Life planning: Undecided #Circ:  no  #gHTN: mild range Bps on admit, asymptomatic - f/u PEC labs - CTM Bps  #LGA: no diabetes, nrl genetics, @[redacted]w[redacted]d , 3475g, 89% EFW, AC >99% - shoulder precautions at delivery - consider TXA ppx   Augustin JAYSON Slade, MD  10/23/2023, 1:22 PM

## 2023-10-23 NOTE — MAU Provider Note (Addendum)
 History     CSN: [id number]  Arrival date and time: 10/23/23 1154   Event Date/Time   First Provider Initiated Contact with Patient    Chief Complaint  Patient presents with   BP Evaluation    HPI  Joann Martinez is a 23 y.o. G2P1001 at [redacted]w[redacted]d who presents to the MAU for evaluation of elevated blood pressure direct from OB office with new diagnosis of gestational hypertension. Patient endorses good fetal movements. Denies HA, visual disturbances, epigastric pain or vaginal bleeding. No contractions, VB, LOF.    Past Medical History:  Diagnosis Date   Pregnancy induced hypertension     Past Surgical History:  Procedure Laterality Date   NO PAST SURGERIES      Family History  Problem Relation Age of Onset   Healthy Mother    Healthy Father     Social History   Tobacco Use   Smoking status: Never   Smokeless tobacco: Never  Vaping Use   Vaping status: Never Used  Substance Use Topics   Alcohol use: Never   Drug use: Never    Allergies: No Known Allergies  Medications Prior to Admission  Medication Sig Dispense Refill Last Dose/Taking   aspirin  EC 81 MG tablet Take 1 tablet (81 mg total) by mouth daily. Swallow whole. 30 tablet 12 10/22/2023   Prenat-Fe Poly-Methfol-FA-DHA (VITAFOL  ULTRA) 29-0.6-0.4-200 MG CAPS Take 1 capsule by mouth daily before breakfast. 90 capsule 4 10/22/2023   acetaminophen  (TYLENOL ) 500 MG tablet Take 2 tablets (1,000 mg total) by mouth every 6 (six) hours as needed (for pain scale < 4). (Patient not taking: Reported on 10/23/2023)      Blood Pressure Monitoring (BLOOD PRESSURE KIT) DEVI 1 kit by Does not apply route once a week. Check Blood Pressure regularly and record readings into the Babyscripts App.  Large Cuff.  DX O90.0 (Patient not taking: Reported on 10/23/2023) 1 each 0    Blood Pressure Monitoring (BLOOD PRESSURE KIT) DEVI 1 Device by Does not apply route once a week. (Patient not taking: Reported on 10/23/2023) 1 each 0     metroNIDAZOLE  (FLAGYL ) 500 MG tablet Take 1 tablet (500 mg total) by mouth 2 (two) times daily. (Patient not taking: Reported on 10/23/2023) 14 tablet 0     ROS reviewed and pertinent positives and negatives as documented in HPI.  Physical Exam   Blood pressure 121/84, pulse 92, temperature 98 F (36.7 C), temperature source Oral, resp. rate 18, height 5' 6 (1.676 m), weight 102.9 kg, last menstrual period 01/26/2023, SpO2 99%, unknown if currently breastfeeding.  Physical Exam General: Alert, well-appearing female, NAD, pleasant HEENT: Normocephalic, atraumatic, EOM grossly intact Respiratory: Breathing comfortably on room air Abdomen: Soft, gravid, appropriate for gestational age Extremities: Moves all four extremities appropriately. Neuro: Alert, no obvious focal deficits Skin: No lesions/rashes visualized Psych: Normal affect and mood   Pregnancy confirmed to be cephalic by U/S  MAU Course  Procedures None  MDM 23 y.o. G2P1001 at [redacted]w[redacted]d presenting for evaluation for pre-eclampsia given new diagnosis of gestational hypertension. She was seen for prenatal appt today, found to have elevated BP w prior elevated BP 7/25 (was 129/92). Today BP was 136/100, repeat 121/84. Patient denies HA, visual disturbances, epigatric pian or vaginal bleeding. Patient does have a history of gestational hypertension with prior pregnancy, normal CMP/CBC/UA, and urine protiein creatinine ratio 0.18 are reassuring against pre-eclampsia. Given patient is at [redacted]w[redacted]d, recommend admission to L&D for induction of labor.    Assessment and  Plan     ICD-10-CM   1. Supervision of other normal pregnancy, antepartum  Z34.80     Plan: Admit for delivery given newly diagnosed gHTN.   Darren Jernigan, DO 10/23/2023 1:56 PM  Attestation of Supervision of Resident:  I confirm that I have verified the information documented in the resident's note and that I have also personally reperformed the history, physical exam and  all medical decision making activities.  I have verified that all services and findings are accurately documented in this resident's note; and I agree with management and plan as outlined in the documentation. I have also made any necessary editorial changes.   Alain Sor, MD OB Fellow, [facility], [facility]

## 2023-10-23 NOTE — Patient Instructions (Signed)
 [address] [place], [place], [place] [address]   Entrance C and ask for MAU (Maternity Assessment Unit)

## 2023-10-24 ENCOUNTER — Inpatient Hospital Stay (HOSPITAL_COMMUNITY)

## 2023-10-24 ENCOUNTER — Inpatient Hospital Stay (HOSPITAL_COMMUNITY): Admission: RE | Admit: 2023-10-24 | Source: Home / Self Care | Admitting: Obstetrics & Gynecology

## 2023-10-24 ENCOUNTER — Inpatient Hospital Stay (HOSPITAL_COMMUNITY): Admitting: Anesthesiology

## 2023-10-24 ENCOUNTER — Encounter (HOSPITAL_COMMUNITY): Payer: Self-pay | Admitting: Obstetrics & Gynecology

## 2023-10-24 DIAGNOSIS — O3663X0 Maternal care for excessive fetal growth, third trimester, not applicable or unspecified: Secondary | ICD-10-CM

## 2023-10-24 DIAGNOSIS — Z3A39 39 weeks gestation of pregnancy: Secondary | ICD-10-CM

## 2023-10-24 DIAGNOSIS — O134 Gestational [pregnancy-induced] hypertension without significant proteinuria, complicating childbirth: Secondary | ICD-10-CM

## 2023-10-24 LAB — CBC
HCT: 35.8 % — ABNORMAL LOW (ref 36.0–46.0)
Hemoglobin: 11.9 g/dL — ABNORMAL LOW (ref 12.0–15.0)
MCH: 26.9 pg (ref 26.0–34.0)
MCHC: 33.2 g/dL (ref 30.0–36.0)
MCV: 80.8 fL (ref 80.0–100.0)
Platelets: 235 K/uL (ref 150–400)
RBC: 4.43 MIL/uL (ref 3.87–5.11)
RDW: 15.8 % — ABNORMAL HIGH (ref 11.5–15.5)
WBC: 10.3 K/uL (ref 4.0–10.5)
nRBC: 0.2 % (ref 0.0–0.2)

## 2023-10-24 LAB — RPR: RPR Ser Ql: NONREACTIVE

## 2023-10-24 MED ORDER — COCONUT OIL OIL: 1.0000 | TOPICAL_OIL | Status: DC | PRN

## 2023-10-24 MED ORDER — DIBUCAINE (PERIANAL) 1 % EX OINT: 1.0000 | TOPICAL_OINTMENT | CUTANEOUS | Status: DC | PRN

## 2023-10-24 MED ORDER — FUROSEMIDE 20 MG PO TABS
20.0000 mg | ORAL_TABLET | Freq: Every day | ORAL | Status: DC
  Administered 2023-10-24 – 2023-10-25 (×2): 20 mg via ORAL
  Filled 2023-10-24 (×2): qty 1

## 2023-10-24 MED ORDER — EPHEDRINE 5 MG/ML INJ: 10.0000 mg | INTRAVENOUS | Status: DC | PRN

## 2023-10-24 MED ORDER — PHENYLEPHRINE 80 MCG/ML (10ML) SYRINGE FOR IV PUSH (FOR BLOOD PRESSURE SUPPORT): 80.0000 ug | PREFILLED_SYRINGE | INTRAVENOUS | Status: DC | PRN

## 2023-10-24 MED ORDER — PRENATAL MULTIVITAMIN CH
1.0000 | ORAL_TABLET | Freq: Every day | ORAL | Status: DC
  Administered 2023-10-24 – 2023-10-25 (×2): 1 via ORAL
  Filled 2023-10-24 (×2): qty 1

## 2023-10-24 MED ORDER — ONDANSETRON HCL 4 MG PO TABS: 4.0000 mg | ORAL_TABLET | ORAL | Status: DC | PRN

## 2023-10-24 MED ORDER — LIDOCAINE HCL (PF) 1 % IJ SOLN
INTRAMUSCULAR | Status: DC | PRN
  Administered 2023-10-24: 5 mL via EPIDURAL

## 2023-10-24 MED ORDER — LACTATED RINGERS IV SOLN
500.0000 mL | Freq: Once | INTRAVENOUS | Status: AC
  Administered 2023-10-24: 500 mL via INTRAVENOUS

## 2023-10-24 MED ORDER — ONDANSETRON HCL 4 MG/2ML IJ SOLN: 4.0000 mg | INTRAMUSCULAR | Status: DC | PRN

## 2023-10-24 MED ORDER — IBUPROFEN 600 MG PO TABS
600.0000 mg | ORAL_TABLET | Freq: Four times a day (QID) | ORAL | Status: DC
  Administered 2023-10-24 – 2023-10-25 (×5): 600 mg via ORAL
  Filled 2023-10-24 (×5): qty 1

## 2023-10-24 MED ORDER — POTASSIUM CHLORIDE CRYS ER 20 MEQ PO TBCR
20.0000 meq | EXTENDED_RELEASE_TABLET | Freq: Every day | ORAL | Status: DC
  Administered 2023-10-24 – 2023-10-25 (×2): 20 meq via ORAL
  Filled 2023-10-24 (×2): qty 1

## 2023-10-24 MED ORDER — DIPHENHYDRAMINE HCL 25 MG PO CAPS
25.0000 mg | ORAL_CAPSULE | Freq: Four times a day (QID) | ORAL | Status: DC | PRN
Start: 2023-10-24 — End: 2023-10-25

## 2023-10-24 MED ORDER — TETANUS-DIPHTH-ACELL PERTUSSIS 5-2.5-18.5 LF-MCG/0.5 IM SUSY: 0.5000 mL | PREFILLED_SYRINGE | Freq: Once | INTRAMUSCULAR | Status: DC

## 2023-10-24 MED ORDER — FENTANYL-BUPIVACAINE-NACL 0.5-0.125-0.9 MG/250ML-% EP SOLN
12.0000 mL/h | EPIDURAL | Status: DC | PRN
  Administered 2023-10-24: 12 mL/h via EPIDURAL
  Filled 2023-10-24: qty 250

## 2023-10-24 MED ORDER — DIPHENHYDRAMINE HCL 50 MG/ML IJ SOLN: 12.5000 mg | INTRAMUSCULAR | Status: DC | PRN

## 2023-10-24 MED ORDER — OXYCODONE HCL 5 MG PO TABS: 5.0000 mg | ORAL_TABLET | Freq: Three times a day (TID) | ORAL | Status: DC | PRN

## 2023-10-24 MED ORDER — CEFAZOLIN SODIUM-DEXTROSE 2-4 GM/100ML-% IV SOLN
2.0000 g | Freq: Once | INTRAVENOUS | Status: AC
  Administered 2023-10-24: 2 g via INTRAVENOUS
  Filled 2023-10-24: qty 100

## 2023-10-24 MED ORDER — BENZOCAINE-MENTHOL 20-0.5 % EX AERO
1.0000 | INHALATION_SPRAY | CUTANEOUS | Status: DC | PRN
  Administered 2023-10-24: 1 via TOPICAL
  Filled 2023-10-24: qty 56

## 2023-10-24 MED ORDER — SENNOSIDES-DOCUSATE SODIUM 8.6-50 MG PO TABS: 2.0000 | ORAL_TABLET | Freq: Every evening | ORAL | Status: DC | PRN

## 2023-10-24 MED ORDER — NIFEDIPINE ER OSMOTIC RELEASE 30 MG PO TB24
30.0000 mg | ORAL_TABLET | Freq: Every day | ORAL | Status: DC
  Administered 2023-10-24 – 2023-10-25 (×2): 30 mg via ORAL
  Filled 2023-10-24 (×2): qty 1

## 2023-10-24 MED ORDER — SIMETHICONE 80 MG PO CHEW: 80.0000 mg | CHEWABLE_TABLET | ORAL | Status: DC | PRN

## 2023-10-24 MED ORDER — OXYTOCIN-SODIUM CHLORIDE 30-0.9 UT/500ML-% IV SOLN: 2.5000 [IU]/h | INTRAVENOUS | Status: DC | PRN

## 2023-10-24 MED ORDER — WITCH HAZEL-GLYCERIN EX PADS: 1.0000 | MEDICATED_PAD | CUTANEOUS | Status: DC | PRN

## 2023-10-24 MED ORDER — ACETAMINOPHEN 325 MG PO TABS
650.0000 mg | ORAL_TABLET | ORAL | Status: DC | PRN
  Administered 2023-10-24: 650 mg via ORAL
  Filled 2023-10-24: qty 2

## 2023-10-24 NOTE — Anesthesia Procedure Notes (Signed)
 Epidural Patient location during procedure: OB Start time: 10/24/2023 1:20 AM End time: 10/24/2023 1:32 AM  Staffing Anesthesiologist: Jefm Garnette LABOR, MD Performed: anesthesiologist   Preanesthetic Checklist Completed: patient identified, IV checked, site marked, risks and benefits discussed, surgical consent, monitors and equipment checked, pre-op evaluation and timeout performed  Epidural Patient position: sitting Prep: DuraPrep and site prepped and draped Patient monitoring: continuous pulse ox and blood pressure Approach: midline Location: L3-L4 Injection technique: LOR air  Needle:  Needle type: Tuohy  Needle gauge: 17 G Needle length: 9 cm and 9 Needle insertion depth: 8 cm Catheter type: closed end flexible Catheter size: 19 Gauge Catheter at skin depth: 14 cm Test dose: negative  Assessment Events: blood not aspirated, no cerebrospinal fluid, injection not painful, no injection resistance, no paresthesia and negative IV test  Additional Notes Patient identified. Risks/Benefits/Options discussed with patient including but not limited to bleeding, infection, nerve damage, paralysis, failed block, incomplete pain control, headache, blood pressure changes, nausea, vomiting, reactions to medication both or allergic, itching and postpartum back pain. Confirmed with bedside nurse the patient's most recent platelet count. Confirmed with patient that they are not currently taking any anticoagulation, have any bleeding history or any family history of bleeding disorders. Patient expressed understanding and wished to proceed. All questions were answered. Sterile technique was used throughout the entire procedure. Please see nursing notes for vital signs. Test dose was given through epidural needle and negative prior to continuing to dose epidural or start infusion. Warning signs of high block given to the patient including shortness of breath, tingling/numbness in hands, complete motor  block, or any concerning symptoms with instructions to call for help. Patient was given instructions on fall risk and not to get out of bed. All questions and concerns addressed with instructions to call with any issues.  1 Attempt (S) . Patient tolerated procedure well.

## 2023-10-24 NOTE — Discharge Summary (Shared)
 Postpartum Discharge Summary  Date of Service updated-8/3     Patient Name: Joann Martinez DOB: May 05, 2000 MRN: [id number]  Date of admission: 10/23/2023 Delivery date:10/24/2023 Delivering provider: Jamesyn Lindell Date of discharge: 10/25/2023  Admitting diagnosis: Encounter for induction of labor [Z34.90] Intrauterine pregnancy: [redacted]w[redacted]d     Secondary diagnosis:  Principal Problem:   Encounter for induction of labor Active Problems:   NSVD (normal spontaneous vaginal delivery)   Gestational hypertension  Additional problems: none    Discharge diagnosis: Term Pregnancy Delivered                                              Post partum procedures:none Augmentation: AROM, Pitocin , Cytotec , and IP Foley Complications: None  Hospital course: Induction of Labor With Vaginal Delivery   23 y.o. yo [id number] at [redacted]w[redacted]d was admitted to the hospital 10/23/2023 for induction of labor.  Indication for induction: LGA.  Patient had an labor course complicated by meeting criteria for gestational hypertension.  Membrane Rupture Time/Date: 3:30 AM,10/24/2023  Delivery Method:Vaginal, Spontaneous Operative Delivery:N/A Episiotomy: None Lacerations:  1st degree;Perineal Details of delivery can be found in separate delivery note.  Patient had a postpartum course complicated by gestHTN- she was treated with Lasix  and K for 7 days and started on ProcardiaXL 30mg  daily.. Patient is discharged home 10/25/23.  Newborn Data: Birth date:10/24/2023 Birth time:5:19 AM Gender:Female Living status:Living Apgars:9 ,9  Weight:4250 g  Magnesium Sulfate received: No BMZ received: No Rhophylac:N/A MMR:No T-DaP:Declined Flu: No RSV Vaccine received: No Transfusion:No  Immunizations received: Immunization History  Administered Date(s) Administered   Influenza,inj,Quad PF,6+ Mos 04/24/2022    Physical exam  Vitals:   10/24/23 1324 10/24/23 1811 10/24/23 2149 10/25/23 0522  BP: 121/86 126/75 (!)  130/92 116/68  Pulse: 76 75 93 82  Resp: 15 18 18 16   Temp: 97.9 F (36.6 C) 98.6 F (37 C) 98.1 F (36.7 C) 97.9 F (36.6 C)  TempSrc: Oral Oral Oral Oral  SpO2:   95% 98%  Weight:      Height:       General: alert, cooperative, and no distress Lochia: appropriate Uterine Fundus: firm Incision: N/A DVT Evaluation: No evidence of DVT seen on physical exam. Labs: Lab Results  Component Value Date   WBC 13.0 (H) 10/25/2023   HGB 11.1 (L) 10/25/2023   HCT 33.5 (L) 10/25/2023   MCV 82.3 10/25/2023   PLT 209 10/25/2023      Latest Ref Rng & Units 10/23/2023   12:40 PM  CMP  Glucose 70 - 99 mg/dL 84   BUN 6 - 20 mg/dL 13   Creatinine 9.55 - 1.00 mg/dL 9.19   Sodium 864 - 854 mmol/L 136   Potassium 3.5 - 5.1 mmol/L 4.0   Chloride 98 - 111 mmol/L 110   CO2 22 - 32 mmol/L 19   Calcium 8.9 - 10.3 mg/dL 8.5   Total Protein 6.5 - 8.1 g/dL 6.7   Total Bilirubin 0.0 - 1.2 mg/dL 0.3   Alkaline Phos 38 - 126 U/L 171   AST 15 - 41 U/L 20   ALT 0 - 44 U/L 15    Edinburgh Score:    10/25/2023    6:36 AM  Edinburgh Postnatal Depression Scale Screening Tool  I have been able to laugh and see the funny side of things. 0  I have  looked forward with enjoyment to things. 0  I have blamed myself unnecessarily when things went wrong. 0  I have been anxious or worried for no good reason. 0  I have felt scared or panicky for no good reason. 0  Things have been getting on top of me. 0  I have been so unhappy that I have had difficulty sleeping. 0  I have felt sad or miserable. 0  I have been so unhappy that I have been crying. 0  The thought of harming myself has occurred to me. 0  Edinburgh Postnatal Depression Scale Total 0   Edinburgh Postnatal Depression Scale Total: 0   After visit meds:  Allergies as of 10/25/2023   No Known Allergies      Medication List     STOP taking these medications    aspirin  EC 81 MG tablet   metroNIDAZOLE  500 MG tablet Commonly known as:  FLAGYL        TAKE these medications    acetaminophen  500 MG tablet Commonly known as: TYLENOL  Take 2 tablets (1,000 mg total) by mouth every 6 (six) hours as needed (for pain scale < 4).   Blood Pressure Kit Devi 1 kit by Does not apply route once a week. Check Blood Pressure regularly and record readings into the Babyscripts App.  Large Cuff.  DX O90.0   Blood Pressure Kit Devi 1 Device by Does not apply route once a week.   furosemide  20 MG tablet Commonly known as: LASIX  Take 1 tablet (20 mg total) by mouth daily for 5 days.   ibuprofen  600 MG tablet Commonly known as: ADVIL  Take 1 tablet (600 mg total) by mouth every 6 (six) hours.   NIFEdipine  30 MG 24 hr tablet Commonly known as: ADALAT  CC Take 1 tablet (30 mg total) by mouth daily.   norethindrone  0.35 MG tablet Commonly known as: MICRONOR  Take 1 tablet (0.35 mg total) by mouth daily.   potassium chloride  SA 20 MEQ tablet Commonly known as: KLOR-CON  M Take 1 tablet (20 mEq total) by mouth daily.   Vitafol  Ultra 29-0.6-0.4-200 MG Caps Take 1 capsule by mouth daily before breakfast.         Discharge home in stable condition Infant Feeding: Breast Infant Disposition:home with mother Discharge instruction: per After Visit Summary and Postpartum booklet. Activity: Advance as tolerated. Pelvic rest for 6 weeks.  Diet: routine diet Future Appointments:No future appointments. Follow up Visit:  Follow-up Information     [facility]. Go in 1 week(s).   Specialty: Obstetrics and Gynecology Why: Follow up in one week for a BP check Contact information: [address] [place]  [phone]               Message to [facility] 8/2  Please schedule this patient for a In person postpartum visit in 4 weeks with the following provider: Any provider. Additional Postpartum F/U:BP check 1 week  High risk pregnancy complicated by:  HTN Delivery mode:  Vaginal, Spontaneous Anticipated Birth Control: POPs   10/25/2023 Jennifer M Ozan, DO

## 2023-10-24 NOTE — Lactation Note (Signed)
 This note was copied from a baby's chart. Lactation Consultation Note  Patient Name: Boy Jilene Spohr Unijb'd Date: 10/24/2023 Age:22 hours Reason for consult: L&D Initial assessment;Term Mom had all ready latched the baby once before LC came into rm. Baby cueing wanting to BF more. Mom latched baby well w/some adjustments of breast tissue. Encouraged to keep baby's cheeks to breast. Mom is afraid baby can't breathe. Mom slightly pulling back on breast tissue. Encouraged not to.  Praised mom for good feeding. Mom stated she has no milk. Mom chooses to BF/formula feed. Mom didn't BF her 1st child. Praised mom for trying to w/this baby. Mom will be f/u on MBU.   Maternal Data Does the patient have breastfeeding experience prior to this delivery?: No  Feeding    LATCH Score Latch: Grasps breast easily, tongue down, lips flanged, rhythmical sucking.  Audible Swallowing: None  Type of Nipple: Everted at rest and after stimulation  Comfort (Breast/Nipple): Soft / non-tender  Hold (Positioning): Assistance needed to correctly position infant at breast and maintain latch.  LATCH Score: 7   Lactation Tools Discussed/Used    Interventions Interventions: Assisted with latch;Skin to skin;Adjust position  Discharge    Consult Status Consult Status: Follow-up from L&D Date: 10/24/23 Follow-up type: In-patient    Haniah Penny G 10/24/2023, 6:29 AM

## 2023-10-24 NOTE — Anesthesia Postprocedure Evaluation (Signed)
 Anesthesia Post Note  Patient: Development worker, community  Procedure(s) Performed: AN AD HOC LABOR EPIDURAL     Patient location during evaluation: Mother Baby Anesthesia Type: Epidural Level of consciousness: awake Pain management: satisfactory to patient Vital Signs Assessment: post-procedure vital signs reviewed and stable Respiratory status: spontaneous breathing Cardiovascular status: stable Anesthetic complications: no   No notable events documented.  Last Vitals:  Vitals:   10/24/23 0810 10/24/23 0932  BP: 130/87 (!) 129/90  Pulse: 78 74  Resp: 16 16  Temp: 36.9 C   SpO2: 98%     Last Pain:  Vitals:   10/24/23 1055  TempSrc:   PainSc: 0-No pain   Pain Goal: Patients Stated Pain Goal: 0 (10/23/23 2026)                 JEANENNE HANDING

## 2023-10-24 NOTE — Anesthesia Preprocedure Evaluation (Signed)
 Anesthesia Evaluation  Patient identified by MRN, date of birth, ID band Patient awake    Reviewed: Allergy & Precautions, NPO status , Patient's Chart, lab work & pertinent test results  Airway Mallampati: II  TM Distance: >3 FB Neck ROM: Full    Dental no notable dental hx. (+) Teeth Intact, Dental Advisory Given   Pulmonary neg pulmonary ROS   Pulmonary exam normal breath sounds clear to auscultation       Cardiovascular hypertension (gHtn), Normal cardiovascular exam Rhythm:Regular Rate:Normal     Neuro/Psych negative neurological ROS  negative psych ROS   GI/Hepatic negative GI ROS, Neg liver ROS,,,  Endo/Other  negative endocrine ROS    Renal/GU negative Renal ROS  negative genitourinary   Musculoskeletal   Abdominal   Peds negative pediatric ROS (+)  Hematology Lab Results      Component                Value               Date                      WBC                      10.3                10/24/2023                HGB                      11.9 (L)            10/24/2023                HCT                      35.8 (L)            10/24/2023                MCV                      80.8                10/24/2023                PLT                      235                 10/24/2023              Anesthesia Other Findings   Reproductive/Obstetrics (+) Pregnancy                              Anesthesia Physical Anesthesia Plan  ASA: 3  Anesthesia Plan: Epidural   Post-op Pain Management:    Induction:   PONV Risk Score and Plan:   Airway Management Planned:   Additional Equipment:   Intra-op Plan:   Post-operative Plan:   Informed Consent: I have reviewed the patients History and Physical, chart, labs and discussed the procedure including the risks, benefits and alternatives for the proposed anesthesia with the patient or authorized representative who has indicated  his/her understanding and acceptance.       Plan Discussed with: CRNA and Surgeon  Anesthesia  Plan Comments: (39.4 G2P1 w gHtn for LEA)        Anesthesia Quick Evaluation

## 2023-10-24 NOTE — Lactation Note (Signed)
 This note was copied from a baby's chart. Lactation Consultation Note  Patient Name: Joann Martinez Unijb'd Date: 10/24/2023 Age:22 hours Reason for consult: Follow-up assessment;Term  P2- LC communicated with FOB primarily as MOB was getting out of the shower. FOB reports that infant is taking both breast milk and formula because MOB's milk is not in yet. MOB reports that this infant feeds much better than her first child. MOB/FOB reports no concerns so far. LC encouraged FOB to have MOB call out for a latch assessment. LC reviewed feeding infant on cue 8-12x in 24 hrs, not allowing infant to go over 3 hrs without a feeding, CDC milk storage guidelines, LC services handout and engorgement/breast care. LC encouraged MOB to call for further assistance as needed.  Maternal Data Has patient been taught Hand Expression?: No Does the patient have breastfeeding experience prior to this delivery?: No  Feeding Mother's Current Feeding Choice: Breast Milk and Formula Nipple Type: Slow - flow  LATCH Score Latch: Grasps breast easily, tongue down, lips flanged, rhythmical sucking.  Audible Swallowing: A few with stimulation  Type of Nipple: Everted at rest and after stimulation  Comfort (Breast/Nipple): Soft / non-tender  Hold (Positioning): No assistance needed to correctly position infant at breast.  LATCH Score: 9   Lactation Tools Discussed/Used Pump Education: Milk Storage  Interventions Interventions: Breast feeding basics reviewed;Education;LC Services brochure  Discharge Discharge Education: Engorgement and breast care;Warning signs for feeding baby Pump: DEBP;Personal (per FOB)  Consult Status Consult Status: Follow-up Date: 10/25/23 Follow-up type: In-patient    Recardo Hoit BS, IBCLC 10/24/2023, 10:27 PM

## 2023-10-24 NOTE — Progress Notes (Signed)
 LABOR PROGRESS NOTE  Patient Name: Joann Martinez, female   DOB: 2000/06/23, 23 y.o.  MRN: [id number]  Pt comfortable with epidural.  R/B/A of AROM discussed with patient, and verbal consent obtained.   Blood pressure (!) 98/57, pulse 73, temperature (!) 97.3 F (36.3 C), temperature source Axillary, resp. rate 17, height 5' 6 (1.676 m), weight 102.9 kg, last menstrual period 01/26/2023, SpO2 99%, unknown if currently breastfeeding.  Dilation: 10 Dilation Complete Date: 10/24/23 Dilation Complete Time: 0330 Effacement (%): 100 Cervical Position: Posterior Station: 0 Presentation: Vertex (OP) Exam by:: Aero Drummonds  EFM: baseline 140, 10x10 accels, single prolonged after epidural with supine positioning decel(resolved with repositioning and d/c of pitocin ), moderate variability TOCO: q2-3.66min contractions  Babe's head found to be well-applied. Obtained moderate light meconium fluid. Mom and babe tolerated well.  Facilitated postioning to improve station, and hopeful OP to OA.  Reassesse when patient is feeling pressure/urge or 2 hours.   Mardy Shropshire, MD

## 2023-10-24 NOTE — Plan of Care (Signed)
  Problem: Education: Goal: Knowledge of Childbirth will improve Outcome: Adequate for Discharge Goal: Ability to make informed decisions regarding treatment and plan of care will improve Outcome: Adequate for Discharge Goal: Ability to state and carry out methods to decrease the pain will improve Outcome: Adequate for Discharge Goal: Individualized Educational Video(s) Outcome: Not Met (add Reason)   Problem: Coping: Goal: Ability to verbalize concerns and feelings about labor and delivery will improve Outcome: Adequate for Discharge   Problem: Life Cycle: Goal: Ability to make normal progression through stages of labor will improve Outcome: Completed/Met Goal: Ability to effectively push during vaginal delivery will improve Outcome: Completed/Met   Problem: Role Relationship: Goal: Will demonstrate positive interactions with the child Outcome: Adequate for Discharge   Problem: Safety: Goal: Risk of complications during labor and delivery will decrease Outcome: Adequate for Discharge   Problem: Pain Management: Goal: Relief or control of pain from uterine contractions will improve Outcome: Adequate for Discharge   

## 2023-10-25 LAB — CBC
HCT: 33.5 % — ABNORMAL LOW (ref 36.0–46.0)
Hemoglobin: 11.1 g/dL — ABNORMAL LOW (ref 12.0–15.0)
MCH: 27.3 pg (ref 26.0–34.0)
MCHC: 33.1 g/dL (ref 30.0–36.0)
MCV: 82.3 fL (ref 80.0–100.0)
Platelets: 209 K/uL (ref 150–400)
RBC: 4.07 MIL/uL (ref 3.87–5.11)
RDW: 15.9 % — ABNORMAL HIGH (ref 11.5–15.5)
WBC: 13 K/uL — ABNORMAL HIGH (ref 4.0–10.5)
nRBC: 0 % (ref 0.0–0.2)

## 2023-10-25 MED ORDER — IBUPROFEN 600 MG PO TABS
600.0000 mg | ORAL_TABLET | Freq: Four times a day (QID) | ORAL | 0 refills | Status: AC
Start: 2023-10-25 — End: ?

## 2023-10-25 MED ORDER — NORETHINDRONE 0.35 MG PO TABS: 1.0000 | ORAL_TABLET | Freq: Every day | ORAL | 4 refills | Status: AC

## 2023-10-25 MED ORDER — NIFEDIPINE ER 30 MG PO TB24: 30.0000 mg | ORAL_TABLET | Freq: Every day | ORAL | 0 refills | Status: AC

## 2023-10-25 MED ORDER — FUROSEMIDE 20 MG PO TABS: 20.0000 mg | ORAL_TABLET | Freq: Every day | ORAL | 0 refills | Status: AC

## 2023-10-25 MED ORDER — POTASSIUM CHLORIDE CRYS ER 20 MEQ PO TBCR: 20.0000 meq | EXTENDED_RELEASE_TABLET | Freq: Every day | ORAL | 0 refills | Status: AC

## 2023-10-25 NOTE — Lactation Note (Signed)
 This note was copied from a baby's chart. Lactation Consultation Note  Patient Name: Joann Martinez Date: 10/25/2023 Age:22 hours Reason for consult: Follow-up assessment;Maternal discharge;Term  P2, 39 wks, @ 31 hrs of life. Encouraged mom to keep working on big mouth latch with baby and use EBM or coconut oil after each feed. Discussed cluster feeding overnight/ early morning brings in our milk supply, shared expectations of milk coming in. Highlighted risk of engorgement. Discussed hand pump/express to soften breasts, motrin  as anti-inflammatory, and ice packs for 10-20 minutes post feed/pumping if still over-full is the best treatments for inflamed/engorged breasts.  Maternal Data Has patient been taught Hand Expression?: Yes Does the patient have breastfeeding experience prior to this delivery?: No  Feeding Mother's Current Feeding Choice: Breast Milk and Formula Nipple Type: Slow - flow   Lactation Tools Discussed/Used Breast pump type: Manual Pump Education: Milk Storage;Setup, frequency, and cleaning  Interventions Interventions: Expressed milk;Hand pump;Education;LC Services brochure;CDC milk storage guidelines  Discharge Discharge Education: Engorgement and breast care Pump: Personal;Manual  Consult Status Consult Status: Complete Date: 10/25/23 Follow-up type: In-patient    Robertha Staples 10/25/2023, 1:18 PM

## 2023-10-25 NOTE — Progress Notes (Signed)
 Ms Acklin is non immune to rubella.  MMR benefits are discussed, she declines vaccine.

## 2023-10-25 NOTE — Progress Notes (Addendum)
 POSTPARTUM PROGRESS NOTE  Post Partum Day 1  Subjective:  Katurah Karapetian is a 23 y.o. [id number] s/p SVD at [redacted]w[redacted]d.  She reports she is doing well. No acute events overnight. She denies any problems with ambulating, voiding or po intake. Denies nausea or vomiting.  Pain is well controlled.  Lochia is similar to a period.  Objective: Blood pressure 116/68, pulse 82, temperature 97.9 F (36.6 C), temperature source Oral, resp. rate 16, height 5' 6 (1.676 m), weight 102.9 kg, last menstrual period 01/26/2023, SpO2 98%, unknown if currently breastfeeding.  Physical Exam:  General: alert, cooperative and no distress Chest: no respiratory distress Heart:regular rate, distal pulses intact Uterine Fundus: firm, appropriately tender DVT Evaluation: No calf swelling or tenderness Extremities: No edema Skin: warm, dry  Recent Labs    10/24/23 0030 10/25/23 0516  HGB 11.9* 11.1*  HCT 35.8* 33.5*    Assessment/Plan: Maycie Luera is a 23 y.o. H7E7997 s/p SVD at [redacted]w[redacted]d   PPD#1 - Doing well  Routine postpartum care  Gestational HTN - Intermittent mild-range BP readings - Furosemide  20 mg daily, Kcl 20 mEq x 5 day course - Nifedipine  30 mg daily (started 8/2)  Contraception: POPs Feeding: Breast and bottle feeding Dispo: meeting milestones appropriately, plan for discharge home with outpatient follow up   LOS: 2 days   Vernell DELENA School, MD FM Resident 10/25/2023, 9:43 AM

## 2023-11-02 ENCOUNTER — Encounter

## 2023-11-09 ENCOUNTER — Telehealth (HOSPITAL_COMMUNITY): Payer: Self-pay | Admitting: *Deleted

## 2023-11-09 NOTE — Telephone Encounter (Signed)
 11/09/2023  Name: Khaniya Tenaglia MRN: [id number] DOB: 2000/08/16  Reason for Call:  Transition of Care Hospital Discharge Call  Contact Status: Patient Contact Status: Message  Language assistant needed:          Follow-Up Questions:    Van Postnatal Depression Scale:  In the Past 7 Days:    PHQ2-9 Depression Scale:     Discharge Follow-up:    Post-discharge interventions: NA  Mliss Sieve, RN 11/09/2023 10:30

## 2023-11-25 ENCOUNTER — Ambulatory Visit: Admitting: Obstetrics & Gynecology

## 2023-12-09 ENCOUNTER — Ambulatory Visit: Admitting: Obstetrics

## 2024-01-21 ENCOUNTER — Ambulatory Visit: Admitting: Obstetrics and Gynecology
